# Patient Record
Sex: Female | Born: 1935 | Race: White | Hispanic: No | Smoking: Former smoker
Health system: Southern US, Community
[De-identification: ages and names within clinical notes are randomized; demographics above are authoritative.]

## PROBLEM LIST (undated history)

## (undated) DIAGNOSIS — R1310 Dysphagia, unspecified: Secondary | ICD-10-CM

## (undated) DIAGNOSIS — D649 Anemia, unspecified: Secondary | ICD-10-CM

## (undated) DIAGNOSIS — J449 Chronic obstructive pulmonary disease, unspecified: Secondary | ICD-10-CM

## (undated) DIAGNOSIS — F259 Schizoaffective disorder, unspecified: Secondary | ICD-10-CM

---

## 2004-04-16 ENCOUNTER — Other Ambulatory Visit: Payer: Self-pay

## 2005-01-27 ENCOUNTER — Ambulatory Visit: Payer: Self-pay

## 2005-10-21 ENCOUNTER — Ambulatory Visit: Payer: Self-pay | Admitting: Internal Medicine

## 2006-03-04 ENCOUNTER — Ambulatory Visit: Payer: Self-pay | Admitting: Family Medicine

## 2006-05-27 ENCOUNTER — Other Ambulatory Visit: Payer: Self-pay

## 2006-05-27 ENCOUNTER — Emergency Department: Payer: Self-pay | Admitting: Emergency Medicine

## 2006-07-08 ENCOUNTER — Emergency Department: Payer: Self-pay | Admitting: Emergency Medicine

## 2006-08-05 ENCOUNTER — Ambulatory Visit: Payer: Self-pay | Admitting: Gastroenterology

## 2006-12-11 ENCOUNTER — Ambulatory Visit: Payer: Self-pay

## 2006-12-17 ENCOUNTER — Inpatient Hospital Stay: Payer: Self-pay | Admitting: *Deleted

## 2006-12-17 ENCOUNTER — Other Ambulatory Visit: Payer: Self-pay

## 2007-01-19 ENCOUNTER — Ambulatory Visit: Payer: Self-pay

## 2007-01-22 ENCOUNTER — Emergency Department: Payer: Self-pay | Admitting: Internal Medicine

## 2007-01-22 ENCOUNTER — Other Ambulatory Visit: Payer: Self-pay

## 2007-03-03 ENCOUNTER — Ambulatory Visit: Payer: Self-pay

## 2007-03-16 ENCOUNTER — Ambulatory Visit: Payer: Self-pay

## 2007-11-08 ENCOUNTER — Ambulatory Visit: Payer: Self-pay

## 2007-12-09 ENCOUNTER — Ambulatory Visit: Payer: Self-pay | Admitting: Unknown Physician Specialty

## 2008-10-01 ENCOUNTER — Emergency Department: Payer: Self-pay | Admitting: Emergency Medicine

## 2010-04-17 ENCOUNTER — Ambulatory Visit: Payer: Self-pay | Admitting: Family Medicine

## 2013-09-20 ENCOUNTER — Inpatient Hospital Stay: Payer: Self-pay | Admitting: Specialist

## 2013-09-20 LAB — COMPREHENSIVE METABOLIC PANEL
BUN: 20 mg/dL — ABNORMAL HIGH (ref 7–18)
Bilirubin,Total: 0.3 mg/dL (ref 0.2–1.0)
Calcium, Total: 8.6 mg/dL (ref 8.5–10.1)
Co2: 29 mmol/L (ref 21–32)
EGFR (African American): >60
EGFR (Non-African Amer.): >60
Glucose: 79 mg/dL (ref 65–99)
Osmolality: 281 (ref 275–301)
SGPT (ALT): 8 U/L — ABNORMAL LOW (ref 12–78)
Sodium: 140 mmol/L (ref 136–145)
Total Protein: 5.5 g/dL — ABNORMAL LOW (ref 6.4–8.2)

## 2013-09-20 LAB — URINALYSIS, COMPLETE
Leukocyte Esterase: NEGATIVE
Nitrite: NEGATIVE
Ph: 7 (ref 4.5–8.0)
Protein: 30
Specific Gravity: 1.015 (ref 1.003–1.030)
Squamous Epithelial: 2
WBC UR: 1 /HPF (ref 0–5)

## 2013-09-20 LAB — CBC
HCT: 36 % (ref 35.0–47.0)
MCH: 29.3 pg (ref 26.0–34.0)
MCV: 89 fL (ref 80–100)
Platelet: 115 10*3/uL — ABNORMAL LOW (ref 150–440)
RBC: 4.02 10*6/uL (ref 3.80–5.20)

## 2013-09-20 LAB — PROTIME-INR
INR: 1.1
Prothrombin Time: 14.2 secs (ref 11.5–14.7)

## 2013-09-20 LAB — TROPONIN I: Troponin-I: <0.02 ng/mL

## 2013-09-21 ENCOUNTER — Ambulatory Visit: Payer: Self-pay | Admitting: Internal Medicine

## 2013-09-21 LAB — BASIC METABOLIC PANEL
Co2: 28 mmol/L (ref 21–32)
Creatinine: 0.63 mg/dL (ref 0.60–1.30)
Potassium: 3.8 mmol/L (ref 3.5–5.1)
Sodium: 138 mmol/L (ref 136–145)

## 2013-09-21 LAB — CBC WITH DIFFERENTIAL/PLATELET
Basophil %: 0.6 %
Eosinophil %: 1.4 %
HCT: 29.4 % — ABNORMAL LOW (ref 35.0–47.0)
HGB: 9.5 g/dL — ABNORMAL LOW (ref 12.0–16.0)
Lymphocyte #: 1.7 10*3/uL (ref 1.0–3.6)
MCH: 28.8 pg (ref 26.0–34.0)
MCHC: 32.3 g/dL (ref 32.0–36.0)
MCV: 89 fL (ref 80–100)
Monocyte #: 0.4 x10 3/mm (ref 0.2–0.9)
Monocyte %: 11.1 %
Neutrophil #: 1.6 10*3/uL (ref 1.4–6.5)
Platelet: 76 10*3/uL — ABNORMAL LOW (ref 150–440)
RBC: 3.29 10*6/uL — ABNORMAL LOW (ref 3.80–5.20)
RDW: 14.8 % — ABNORMAL HIGH (ref 11.5–14.5)
WBC: 3.8 10*3/uL (ref 3.6–11.0)

## 2013-09-22 LAB — CBC WITH DIFFERENTIAL/PLATELET
Basophil #: 0 10*3/uL (ref 0.0–0.1)
Eosinophil #: 0.1 10*3/uL (ref 0.0–0.7)
HGB: 11 g/dL — ABNORMAL LOW (ref 12.0–16.0)
MCH: 29.2 pg (ref 26.0–34.0)
MCV: 89 fL (ref 80–100)
Monocyte #: 0.6 x10 3/mm (ref 0.2–0.9)
Monocyte %: 11.2 %
Neutrophil %: 53.1 %
Platelet: 83 10*3/uL — ABNORMAL LOW (ref 150–440)
RBC: 3.77 10*6/uL — ABNORMAL LOW (ref 3.80–5.20)
WBC: 5.4 10*3/uL (ref 3.6–11.0)

## 2013-09-23 LAB — CBC WITH DIFFERENTIAL/PLATELET
Basophil #: 0.1 10*3/uL (ref 0.0–0.1)
Basophil %: 0.9 %
Eosinophil %: 3 %
HCT: 33 % — ABNORMAL LOW (ref 35.0–47.0)
HGB: 10.8 g/dL — ABNORMAL LOW (ref 12.0–16.0)
Lymphocyte #: 1.5 10*3/uL (ref 1.0–3.6)
Lymphocyte %: 25.1 %
MCV: 89 fL (ref 80–100)
Neutrophil %: 61.2 %
Platelet: 76 10*3/uL — ABNORMAL LOW (ref 150–440)
RBC: 3.7 10*6/uL — ABNORMAL LOW (ref 3.80–5.20)
RDW: 15.1 % — ABNORMAL HIGH (ref 11.5–14.5)
WBC: 6.1 10*3/uL (ref 3.6–11.0)

## 2013-09-25 LAB — CULTURE, BLOOD (SINGLE)

## 2013-10-13 ENCOUNTER — Ambulatory Visit: Payer: Self-pay | Admitting: Internal Medicine

## 2014-01-09 ENCOUNTER — Ambulatory Visit: Payer: Self-pay

## 2014-08-10 ENCOUNTER — Ambulatory Visit: Payer: Self-pay | Admitting: Family

## 2015-02-02 NOTE — H&P (Signed)
PATIENT NAME:  Emily HammingIKE, [name] DATE OF BIRTH:  01/20/1932  DATE OF ADMISSION:  09/20/2013  REFERRING PHYSICIAN: Dr. Lucrezia EuropeAllison Bowman.  PRIMARY CARE PHYSICIAN: Dr. Jamesetta SoWhite Bowman  CHIEF COMPLAINT: Altered mental status, weakness.  HISTORY OF PRESENT ILLNESS: This is a 81 year old female who lives at home by herself, with the help of her daughter. Daughter reports she takes care of her mother, reports she usually lives by herself, ambulates by herself, even though she has a walker and cane at home, but she refuses to use them. She has a history of baseline dementia. Daughter reports her mother has been feeling different for the last few days. Reports she has been more confused than her baseline, feeling more weak, requiring more assistance with her daily chores. As well, she reports she has had decreased appetite, decreased p.o. intake and did not ambulate over the last three days due to her weakness. Reports she has been staying with her over the last week or so for help. She denies any fever. She denies her mother having any fever or chills or cough. She thought she had a urinary tract infection, as she had similar presentation last month, where she went to PCP for treatment of urinary tract infection. Her urinalysis came back negative, but the chest x-ray did show evidence of opacity. Hospitalist service was requested to admit the patient for treatment of pneumonia.   PAST MEDICAL HISTORY: 1.  Depression.  2.  Gastroesophageal reflux disease. 3.  Hyperlipidemia.   4.  Chronic obstructive pulmonary disease.  PAST SURGICAL HISTORY: Hysterectomy.   ALLERGIES: ASPIRIN CAUSES GASTROINTESTINAL UPSET.   SOCIAL HISTORY: The patient used to smoke 2 packs of cigarettes on a daily basis. She quit six years ago. No history of alcohol or illicit drug use. Lives at home by herself, with her daughter visiting her on a daily basis, living with her currently for the last week.   FAMILY HISTORY:  Significant for diabetes mellitus and coronary artery disease.   REVIEW OF SYSTEMS: Tried to obtain from the patient. The patient is a very poor historian due to her dementia as well as to altered mental status. Was unable to obtain reliable review of systems.   HOME MEDICATIONS: 1.  Depakote extended release 100 mg oral daily.  2.  Imipramine 50 mg oral at bedtime.  4.  Simvastatin 20 mg oral daily.  5.  Oxybutynin 5 mg 2 times a day as needed.  6.  Famotidine 20 mg oral 2 times a day.  7.  Patient is supposed to be taking Advair 250/50, 1 puff b.i.d. and DuoNebs as needed, but she  is not taking them due to noncompliance, as per her daughter.  PHYSICAL EXAMINATION: VITAL SIGNS: Temperature 98, pulse 82, respiratory rate 20, blood pressure 120/77, saturating 97% on room air.  GENERAL: Elderly female who is comfortable and in no apparent distress.  HEENT: Head atraumatic, normocephalic. Pupils are equal, reactive to light. Pale conjunctivae. Anicteric sclerae. Dry oral mucosa.  NECK: Supple. No thyromegaly. No JVD.  CHEST: Good air entry bilaterally. No wheezing, but had mild rales on the left side with good air entry, no use of accessory muscles. CARDIOVASCULAR: S1, S2 heard. No rubs, murmurs, or gallops.  ABDOMEN: Soft, nontender, nondistended. Bowel sounds present.  EXTREMITIES: No edema. No clubbing. No cyanosis. Pedal pulses felt bilaterally.  PSYCHIATRIC: The patient is sleeping comfortably. Does wake up and respond to verbal commands. Alert x2, but confused.  NEUROLOGICAL: The patient is  noncompliant with the physical exam, but was able to move all her extremities, grossly, without any focal deficits being noticed. Cranial nerves appear to be grossly intact.  LYMPHATIC:  No cervical lymphadenopathy could be appreciated.   PERTINENT LABORATORY DATA: Glucose 79, BUN 20, creatinine 0.71, sodium 140, potassium 3.9, chloride 104, CO2 29, ALT 8, AST less than 3, alkaline phosphatase 69,  albumin 2.8, total protein 5.5. Troponin less than 0.02. White blood cells 7.4, hemoglobin 11.8, hematocrit 36, platelets 115. INR 1.1. Urine negative for leukocyte esterase and nitrite. IMAGING: Chest x-ray, PA and lateral, showing mild retrocardiac air space opacity; may reflect mild pneumonia. CT head without contrast showing atrophy and small vessel disease. No evidence of acute intracranial abnormality.   ASSESSMENT AND PLAN: 1.  Altered mental status, weakness. This is most likely related metabolic encephalopathy from her pneumonia, although the patient has baseline dementia. We will consult physical therapy.  2.  Pneumonia. The patient will be treated for community-acquired pneumonia. She will be started on Rocephin and Zithromax.  3.  History of chronic obstructive pulmonary disease, appears to be not in chronic obstructive pulmonary disease exacerbation. We will continue her on Advair and DuoNebs.  4.  Hyperlipidemia. Continue with statin.  5.  History of depression and psychiatric history. The patient will be continued on her home medications, including venlafaxine and Depakote.  6.  Gastroesophageal reflux disease. Continue with famotidine.  7.  Deep vein thrombosis prophylaxis. Subcutaneous heparin.   CODE STATUS: Discussed with daughter, and son-in-law. Reports that her mother is full code, and they wish for her to go back home once she is finished treatment.   TIME SPENT ON ADMISSION AND PATIENT CARE: 50 minutes.     ____________________________ Emily Arms, MD dse:cg D: 09/20/2013 03:14:00 ET T: 09/20/2013 04:05:09 ET JOB#: [id number]  cc: Emily Arms, MD, <Dictator> Emily Tschantz Teena Irani MD ELECTRONICALLY SIGNED 09/21/2013 8:04

## 2015-02-02 NOTE — Consult Note (Signed)
   Comments   I met with pt's daughter and her significant. The 2 of them live with pt and are her primary caregivers. Daughter is pt's only child and, since husband is deceased, is pt's HCPOA. Family plan for pt to return home at discharge. I spoke with them about PT recommendation for STR and they are in agreement with this. Spoke with CSW who will speak with family. spoke with family about pt's code status. Daughter says that pt's father was on a vent prior to his death and pt always said she did not want to be on "life support". Daughter feels that pt should be a DNR. Order entered. Out-of-facility DNR completed and placed in chart.   Electronic Signatures: Vincentina Sollers, Izora Gala (MD)  (Signed 11-Dec-14 15:07)  Authored: Palliative Care   Last Updated: 11-Dec-14 15:07 by Peace Noyes, Izora Gala (MD)

## 2015-02-02 NOTE — Discharge Summary (Signed)
PATIENT NAME:  [name], Shanaia O MR#:  [id number] DATE OF BIRTH:  1932/06/17  DATE OF ADMISSION:  09/20/2013 DATE OF DISCHARGE:  09/23/2013  For a detailed note, please take a look at the history and physical done on admission by Dr. Randol KernElgergawy.  DISCHARGE DIAGNOSES: 1.  Altered mental status secondary to metabolic encephalopathy from pneumonia.  2.  Pneumonia.  3.  History of dementia and schizoaffective disorder. 4.  Hyperlipidemia. 5.  Urinary incontinence. 6.  Thrombocytopenia.   DISCHARGE DIET: The patient is being discharged on pureed diet with thin liquids with strict aspiration precautions. Meds in puree.  DISCHARGE ACTIVITY: As tolerated.   DISCHARGE FOLLOWUP: With primary care physician at the skilled nursing facility. The patient is being discharged to [facility].   DISCHARGE MEDICATIONS:  1.  Imipramine 50 mg at bedtime. 2.  Simvastatin 20 mg daily. 3.  Depakote 500 mg daily. 4.  Oxybutynin 5 mg b.i.d. 5.  Effexor 150 mg daily. 6.  Famotidine 20 mg b.i.d. 7.  Augmentin 500/125 1 tab b.i.d. x7 days.   CONSULTANTS DURING HOSPITAL COURSE: Dr. Harriett SineNancy Phifer from palliative care.   PERTINENT STUDIES DONE DURING THE HOSPITAL COURSE: A CT scan of the head done on admission without contrast showing atrophy with small vessel ischemic disease. No evidence of acute intracranial process. A chest x-ray done on December 9th showing mild retrocardiac air space opacity, may reflect mild pneumonia. A repeat chest x-ray done on December 11th showing right basilar air space disease, could be due to atelectasis, aspiration, or pneumonia.   HOSPITAL COURSE: This is a 81 year old female who presented to the hospital from her home due to altered mental status.  1.  Altered mental status. The most likely cause of this was metabolic encephalopathy due to underlying pneumonia. The patient was noted to have a pneumonia on chest x-ray on admission, empirically started on IV ceftriaxone and Zithromax.  She underwent a CT head which showed no evidence of any acute intracranial abnormality. She had no other focal metabolic or infectious etiology to explain her mental status change. After getting IV antibiotics for a few days her mental status has significantly improved and now back to baseline. Therefore, she is being discharged.  2.  Pneumonia. Initially, the patient was treated for community-acquired pneumonia with ceftriaxone and Zithromax and then switched over to Zosyn for a day as she possibly may have had an aspiration event, although the day after her possible aspiration event she has clinically significantly improved. Her O2 sats are in the 90s on room air. She is tolerating a pureed diet significantly well. At this point, I will be discharging her on oral Augmentin for the next 7 days.  3.  Hyperlipidemia. The patient was maintained on simvastatin. She will resume that. 4.  History of dementia with schizoaffective disorder. She will continue her Effexor and Depakote. 5.  Neurogenic bladder/incontinence. Continue the oxybutynin and imipramine.  6.  Thrombocytopenia. The patient's platelet count was as low as 80,000. She has had no evidence of acute bleeding. She was started on heparin subcu for DVT prophylaxis, which was discontinued. Her thrombocytopenia could be related to her Depakote that she is taking, but she has had no acute bleeding. This further needs to be followed as an outpatient with serial CBCs.  7.  Failure to thrive. This was likely secondary to advanced dementia. A palliative care consult was obtained to discuss goals of care with the family and the patient was a full code prior to coming  in, although now is a DNI/DNR. The patient was seen by physical therapy and thought she would benefit from short-term rehab as she came from home. The family is in agreement with this plan.   TIME SPENT WITH DISCHARGE: 40 minutes.  ____________________________ Rolly Pancake. Cherlynn Kaiser,  MD vjs:sb D: 09/23/2013 14:13:22 ET T: 09/23/2013 14:44:18 ET JOB#: [id number]  cc: Rolly Pancake. Cherlynn Kaiser, MD, <Dictator> Houston Siren MD ELECTRONICALLY SIGNED 10/06/2013 13:40

## 2015-05-26 ENCOUNTER — Emergency Department: Payer: Medicare (Managed Care)

## 2015-05-26 ENCOUNTER — Encounter: Payer: Self-pay | Admitting: *Deleted

## 2015-05-26 ENCOUNTER — Inpatient Hospital Stay
Admission: EM | Admit: 2015-05-26 | Discharge: 2015-06-14 | DRG: 391 | Disposition: E | Payer: Medicare (Managed Care) | Attending: Internal Medicine | Admitting: Internal Medicine

## 2015-05-26 DIAGNOSIS — F0390 Unspecified dementia without behavioral disturbance: Secondary | ICD-10-CM | POA: Diagnosis present

## 2015-05-26 DIAGNOSIS — F259 Schizoaffective disorder, unspecified: Secondary | ICD-10-CM | POA: Diagnosis present

## 2015-05-26 DIAGNOSIS — Z66 Do not resuscitate: Secondary | ICD-10-CM | POA: Diagnosis present

## 2015-05-26 DIAGNOSIS — R1310 Dysphagia, unspecified: Secondary | ICD-10-CM | POA: Diagnosis present

## 2015-05-26 DIAGNOSIS — J9690 Respiratory failure, unspecified, unspecified whether with hypoxia or hypercapnia: Secondary | ICD-10-CM

## 2015-05-26 DIAGNOSIS — N390 Urinary tract infection, site not specified: Secondary | ICD-10-CM | POA: Diagnosis present

## 2015-05-26 DIAGNOSIS — R1030 Lower abdominal pain, unspecified: Secondary | ICD-10-CM | POA: Diagnosis not present

## 2015-05-26 DIAGNOSIS — Z888 Allergy status to other drugs, medicaments and biological substances status: Secondary | ICD-10-CM | POA: Diagnosis not present

## 2015-05-26 DIAGNOSIS — Z79899 Other long term (current) drug therapy: Secondary | ICD-10-CM | POA: Diagnosis not present

## 2015-05-26 DIAGNOSIS — D649 Anemia, unspecified: Secondary | ICD-10-CM | POA: Diagnosis present

## 2015-05-26 DIAGNOSIS — J9600 Acute respiratory failure, unspecified whether with hypoxia or hypercapnia: Secondary | ICD-10-CM | POA: Diagnosis present

## 2015-05-26 DIAGNOSIS — J449 Chronic obstructive pulmonary disease, unspecified: Secondary | ICD-10-CM | POA: Diagnosis present

## 2015-05-26 DIAGNOSIS — K3189 Other diseases of stomach and duodenum: Principal | ICD-10-CM | POA: Diagnosis present

## 2015-05-26 DIAGNOSIS — I509 Heart failure, unspecified: Secondary | ICD-10-CM | POA: Diagnosis present

## 2015-05-26 DIAGNOSIS — K449 Diaphragmatic hernia without obstruction or gangrene: Secondary | ICD-10-CM | POA: Diagnosis not present

## 2015-05-26 DIAGNOSIS — Z87891 Personal history of nicotine dependence: Secondary | ICD-10-CM

## 2015-05-26 DIAGNOSIS — J690 Pneumonitis due to inhalation of food and vomit: Secondary | ICD-10-CM | POA: Diagnosis present

## 2015-05-26 HISTORY — DX: Anemia, unspecified: D64.9

## 2015-05-26 HISTORY — DX: Chronic obstructive pulmonary disease, unspecified: J44.9

## 2015-05-26 HISTORY — DX: Dysphagia, unspecified: R13.10

## 2015-05-26 HISTORY — DX: Schizoaffective disorder, unspecified: F25.9

## 2015-05-26 LAB — COMPREHENSIVE METABOLIC PANEL
ALBUMIN: 3.7 g/dL (ref 3.5–5.0)
ALK PHOS: 92 U/L (ref 38–126)
ALT: 15 U/L (ref 14–54)
AST: 24 U/L (ref 15–41)
Anion gap: 10 (ref 5–15)
BILIRUBIN TOTAL: 0.4 mg/dL (ref 0.3–1.2)
BUN: 24 mg/dL — AB (ref 6–20)
CALCIUM: 9.6 mg/dL (ref 8.9–10.3)
CHLORIDE: 99 mmol/L — AB (ref 101–111)
CO2: 31 mmol/L (ref 22–32)
Creatinine, Ser: 0.9 mg/dL (ref 0.44–1.00)
GFR, EST NON AFRICAN AMERICAN: 58 mL/min — AB (ref >60)
GLUCOSE: 157 mg/dL — AB (ref 65–99)
Potassium: 4.4 mmol/L (ref 3.5–5.1)
Sodium: 140 mmol/L (ref 135–145)
Total Protein: 6.9 g/dL (ref 6.5–8.1)

## 2015-05-26 LAB — TSH: TSH: 4.079 u[IU]/mL (ref 0.350–4.500)

## 2015-05-26 LAB — LIPASE, BLOOD: LIPASE: 16 U/L — AB (ref 22–51)

## 2015-05-26 LAB — URINALYSIS COMPLETE WITH MICROSCOPIC (ARMC ONLY)
BILIRUBIN URINE: NEGATIVE
GLUCOSE, UA: NEGATIVE mg/dL
HGB URINE DIPSTICK: NEGATIVE
LEUKOCYTES UA: NEGATIVE
NITRITE: POSITIVE — AB
PH: 5 (ref 5.0–8.0)
PROTEIN: 30 mg/dL — AB
RBC / HPF: NONE SEEN RBC/hpf (ref 0–5)
Specific Gravity, Urine: 1.023 (ref 1.005–1.030)

## 2015-05-26 LAB — HEMOGLOBIN A1C: Hgb A1c MFr Bld: 6.2 % — ABNORMAL HIGH (ref 4.0–6.0)

## 2015-05-26 LAB — CBC
HEMATOCRIT: 42.1 % (ref 35.0–47.0)
HEMOGLOBIN: 13.4 g/dL (ref 12.0–16.0)
MCH: 30.8 pg (ref 26.0–34.0)
MCHC: 31.8 g/dL — ABNORMAL LOW (ref 32.0–36.0)
MCV: 96.8 fL (ref 80.0–100.0)
PLATELETS: 231 10*3/uL (ref 150–440)
RBC: 4.34 MIL/uL (ref 3.80–5.20)
RDW: 14 % (ref 11.5–14.5)
WBC: 18.4 10*3/uL — ABNORMAL HIGH (ref 3.6–11.0)

## 2015-05-26 LAB — GLUCOSE, CAPILLARY: Glucose-Capillary: 142 mg/dL — ABNORMAL HIGH (ref 65–99)

## 2015-05-26 MED ORDER — ENSURE PLUS PO LIQD: 237.0000 mL | Freq: Every day | ORAL | Status: DC

## 2015-05-26 MED ORDER — IOHEXOL 240 MG/ML SOLN
25.0000 mL | Freq: Once | INTRAMUSCULAR | Status: AC | PRN
  Administered 2015-05-26: 25 mL via ORAL

## 2015-05-26 MED ORDER — DOCUSATE SODIUM 100 MG PO CAPS
100.0000 mg | ORAL_CAPSULE | Freq: Every day | ORAL | Status: DC | PRN
  Administered 2015-05-26: 100 mg via ORAL
  Filled 2015-05-26: qty 1

## 2015-05-26 MED ORDER — VITAMIN D (ERGOCALCIFEROL) 1.25 MG (50000 UNIT) PO CAPS
50000.0000 [IU] | ORAL_CAPSULE | ORAL | Status: DC
  Administered 2015-05-26: 50000 [IU] via ORAL
  Filled 2015-05-26: qty 1

## 2015-05-26 MED ORDER — ERGOCALCIFEROL 1.25 MG (50000 UT) PO CAPS: 50000.0000 [IU] | ORAL_CAPSULE | ORAL | Status: DC

## 2015-05-26 MED ORDER — PANTOPRAZOLE SODIUM 40 MG IV SOLR
40.0000 mg | Freq: Two times a day (BID) | INTRAVENOUS | Status: DC
  Administered 2015-05-26 – 2015-05-28 (×4): 40 mg via INTRAVENOUS
  Filled 2015-05-26 (×4): qty 40

## 2015-05-26 MED ORDER — MORPHINE SULFATE 2 MG/ML IJ SOLN
2.0000 mg | Freq: Once | INTRAMUSCULAR | Status: AC
  Administered 2015-05-26: 2 mg via INTRAVENOUS
  Filled 2015-05-26: qty 1

## 2015-05-26 MED ORDER — DEXTROSE 5 % IV SOLN
1.0000 g | INTRAVENOUS | Status: DC
  Filled 2015-05-26 (×2): qty 10

## 2015-05-26 MED ORDER — VITAMIN B-12 1000 MCG PO TABS
1000.0000 ug | ORAL_TABLET | ORAL | Status: DC
  Administered 2015-05-26: 1000 ug via ORAL
  Filled 2015-05-26: qty 1

## 2015-05-26 MED ORDER — ENSURE ENLIVE PO LIQD
237.0000 mL | Freq: Every day | ORAL | Status: DC
  Administered 2015-05-26: 237 mL via ORAL

## 2015-05-26 MED ORDER — FERROUS SULFATE 325 (65 FE) MG PO TABS
325.0000 mg | ORAL_TABLET | Freq: Two times a day (BID) | ORAL | Status: DC
  Administered 2015-05-26: 325 mg via ORAL
  Filled 2015-05-26 (×2): qty 1

## 2015-05-26 MED ORDER — HEPARIN SODIUM (PORCINE) 5000 UNIT/ML IJ SOLN
5000.0000 [IU] | Freq: Three times a day (TID) | INTRAMUSCULAR | Status: DC
  Administered 2015-05-26 – 2015-05-28 (×7): 5000 [IU] via SUBCUTANEOUS
  Filled 2015-05-26 (×7): qty 1

## 2015-05-26 MED ORDER — IMIPRAMINE HCL 25 MG PO TABS
50.0000 mg | ORAL_TABLET | Freq: Every day | ORAL | Status: DC
  Filled 2015-05-26: qty 2

## 2015-05-26 MED ORDER — DEXTROSE 5 % IV SOLN
1.0000 g | Freq: Once | INTRAVENOUS | Status: AC
  Administered 2015-05-26: 1 g via INTRAVENOUS
  Filled 2015-05-26: qty 10

## 2015-05-26 MED ORDER — SODIUM CHLORIDE 0.9 % IV SOLN
INTRAVENOUS | Status: DC
  Administered 2015-05-26 – 2015-05-27 (×3): via INTRAVENOUS

## 2015-05-26 MED ORDER — MORPHINE SULFATE 2 MG/ML IJ SOLN
1.0000 mg | INTRAMUSCULAR | Status: DC | PRN
  Administered 2015-05-26 – 2015-05-28 (×4): 1 mg via INTRAVENOUS
  Filled 2015-05-26 (×4): qty 1

## 2015-05-26 MED ORDER — ACETAMINOPHEN 650 MG RE SUPP: 650.0000 mg | Freq: Four times a day (QID) | RECTAL | Status: DC | PRN

## 2015-05-26 MED ORDER — ONDANSETRON HCL 4 MG/2ML IJ SOLN
4.0000 mg | Freq: Once | INTRAMUSCULAR | Status: AC
  Administered 2015-05-26: 4 mg via INTRAVENOUS
  Filled 2015-05-26: qty 2

## 2015-05-26 MED ORDER — POLYETHYLENE GLYCOL 3350 17 G PO PACK
17.0000 g | PACK | Freq: Every day | ORAL | Status: DC | PRN
  Administered 2015-05-26: 17 g via ORAL
  Filled 2015-05-26: qty 1

## 2015-05-26 MED ORDER — IOHEXOL 300 MG/ML  SOLN
80.0000 mL | Freq: Once | INTRAMUSCULAR | Status: AC | PRN
  Administered 2015-05-26: 80 mL via INTRAVENOUS

## 2015-05-26 MED ORDER — DOCUSATE SODIUM 100 MG PO CAPS
100.0000 mg | ORAL_CAPSULE | Freq: Two times a day (BID) | ORAL | Status: DC
Start: 2015-05-26 — End: 2015-05-27
  Administered 2015-05-26: 100 mg via ORAL
  Filled 2015-05-26 (×2): qty 1

## 2015-05-26 MED ORDER — CARBOXYMETHYLCELLUL-GLYCERIN 0.5-0.9 % OP SOLN: 2.0000 [drp] | Freq: Every day | OPHTHALMIC | Status: DC

## 2015-05-26 MED ORDER — DIVALPROEX SODIUM ER 500 MG PO TB24
500.0000 mg | ORAL_TABLET | Freq: Every day | ORAL | Status: DC
  Administered 2015-05-26: 500 mg via ORAL
  Filled 2015-05-26: qty 1

## 2015-05-26 MED ORDER — HYDROMORPHONE HCL 1 MG/ML IJ SOLN: 0.5000 mg | INTRAMUSCULAR | Status: DC | PRN

## 2015-05-26 MED ORDER — VENLAFAXINE HCL ER 75 MG PO CP24
150.0000 mg | ORAL_CAPSULE | ORAL | Status: DC
  Administered 2015-05-26: 150 mg via ORAL
  Filled 2015-05-26: qty 2

## 2015-05-26 MED ORDER — ONDANSETRON 4 MG PO TBDP
4.0000 mg | ORAL_TABLET | Freq: Three times a day (TID) | ORAL | Status: DC | PRN
  Administered 2015-05-27: 4 mg via ORAL
  Filled 2015-05-26 (×2): qty 1

## 2015-05-26 MED ORDER — ACETAMINOPHEN 325 MG PO TABS: 650.0000 mg | ORAL_TABLET | Freq: Four times a day (QID) | ORAL | Status: DC | PRN

## 2015-05-26 MED ORDER — PANTOPRAZOLE SODIUM 40 MG PO TBEC
40.0000 mg | DELAYED_RELEASE_TABLET | Freq: Every day | ORAL | Status: DC
  Administered 2015-05-26: 40 mg via ORAL
  Filled 2015-05-26: qty 1

## 2015-05-26 MED ORDER — SODIUM CHLORIDE 0.9 % IV BOLUS (SEPSIS)
500.0000 mL | Freq: Once | INTRAVENOUS | Status: AC
  Administered 2015-05-26: 500 mL via INTRAVENOUS

## 2015-05-26 MED ORDER — SUCRALFATE 1 G PO TABS
1.0000 g | ORAL_TABLET | Freq: Three times a day (TID) | ORAL | Status: DC
  Administered 2015-05-26 (×3): 1 g via ORAL
  Filled 2015-05-26 (×3): qty 1

## 2015-05-26 MED ORDER — OXYBUTYNIN CHLORIDE ER 5 MG PO TB24
5.0000 mg | ORAL_TABLET | Freq: Every day | ORAL | Status: DC
  Filled 2015-05-26: qty 1

## 2015-05-26 MED ORDER — POLYVINYL ALCOHOL 1.4 % OP SOLN
2.0000 [drp] | Freq: Every day | OPHTHALMIC | Status: DC
  Administered 2015-05-26 – 2015-05-28 (×5): 2 [drp] via OPHTHALMIC
  Filled 2015-05-26 (×3): qty 15

## 2015-05-26 MED ORDER — SIMVASTATIN 20 MG PO TABS
20.0000 mg | ORAL_TABLET | Freq: Every evening | ORAL | Status: DC
  Administered 2015-05-26: 20 mg via ORAL
  Filled 2015-05-26: qty 1

## 2015-05-26 NOTE — Consult Note (Signed)
GI Inpatient Consult Note  Reason for Consult: Abdominal pain and abnormal x-ray showing high grade gastric volvulus.   Attending Requesting Consult:  History of Present Illness: [name] is a 81 y.o. female Who lives in a rest home and presented to the ER with abdominal pain and vomiting.  Her CT showed a large paraesophageal involving most of the stomach.  She is demented but can answer questions that are simple and perform simple commands.  Past Medical History:  Past Medical History  Diagnosis Date  . Schizoaffective disorder   . Anemia   . COPD (chronic obstructive pulmonary disease)   . Dysphagia     Problem List: Patient Active Problem List   Diagnosis Date Noted  . UTI (lower urinary tract infection) 06/25/2015    Past Surgical History: History reviewed. No pertinent past surgical history.  Allergies: Allergies  Allergen Reactions  . Aspirin     Home Medications: Prescriptions prior to admission  Medication Sig Dispense Refill Last Dose  . Carboxymethylcellul-Glycerin (OPTIVE) 0.5-0.9 % SOLN Place 2 drops into the left eye 6 (six) times daily.   05/25/2015 at Unknown time  . divalproex (DEPAKOTE ER) 500 MG 24 hr tablet Take 500 mg by mouth daily.   05/25/2015 at Unknown time  . docusate sodium (COLACE) 100 MG capsule Take 100 mg by mouth daily as needed for mild constipation (at bedtime).   unknown at unknown  . ENSURE PLUS (ENSURE PLUS) LIQD Take 237 mLs by mouth daily.   05/25/2015 at Unknown time  . ergocalciferol (VITAMIN D2) 50000 UNITS capsule Take 50,000 Units by mouth every 30 (thirty) days.   unknown at unknown  . ferrous sulfate (FEOSOL) 325 (65 FE) MG tablet Take 325 mg by mouth 2 (two) times daily.   05/25/2015 at Unknown time  . imipramine (TOFRANIL) 50 MG tablet Take 50 mg by mouth at bedtime.   05/25/2015 at Unknown time  . ondansetron (ZOFRAN-ODT) 4 MG disintegrating tablet Take 4 mg by mouth every 8 (eight) hours as needed for nausea or vomiting.    05/25/2015 at Unknown time  . oxybutynin (DITROPAN-XL) 5 MG 24 hr tablet Take 5 mg by mouth at bedtime.   05/25/2015 at Unknown time  . pantoprazole (PROTONIX) 40 MG tablet Take 40 mg by mouth daily.   05/25/2015 at Unknown time  . polyethylene glycol (MIRALAX / GLYCOLAX) packet Take 17 g by mouth daily as needed for mild constipation.   unknown at unknown  . simvastatin (ZOCOR) 20 MG tablet Take 20 mg by mouth every evening.   05/25/2015 at Unknown time  . sucralfate (CARAFATE) 1 G tablet Take 1 g by mouth 3 (three) times daily.   05/25/2015 at Unknown time  . venlafaxine XR (EFFEXOR-XR) 150 MG 24 hr capsule Take 150 mg by mouth every morning.   05/25/2015 at Unknown time  . vitamin B-12 (CYANOCOBALAMIN) 1000 MCG tablet Take 1,000 mcg by mouth every morning.   05/25/2015 at Unknown time   Home medication reconciliation was completed with the patient.   Scheduled Inpatient Medications:   . [START ON 05/27/2015] cefTRIAXone (ROCEPHIN)  IV  1 g Intravenous Q24H  . divalproex  500 mg Oral Daily  . docusate sodium  100 mg Oral BID  . feeding supplement (ENSURE ENLIVE)  237 mL Oral Daily  . ferrous sulfate  325 mg Oral BID  . heparin  5,000 Units Subcutaneous 3 times per day  . imipramine  50 mg Oral QHS  . oxybutynin  5 mg Oral QHS  . pantoprazole (PROTONIX) IV  40 mg Intravenous Q12H  . polyvinyl alcohol  2 drop Left Eye 6 X Daily  . simvastatin  20 mg Oral QPM  . sucralfate  1 g Oral TID  . venlafaxine XR  150 mg Oral BH-q7a  . vitamin B-12  1,000 mcg Oral BH-q7a  . Vitamin D (Ergocalciferol)  50,000 Units Oral Q30 days    Continuous Inpatient Infusions:   . sodium chloride 100 mL/hr at June 22, 2015 0749    PRN Inpatient Medications:  acetaminophen **OR** acetaminophen, docusate sodium, HYDROmorphone (DILAUDID) injection, morphine injection, ondansetron, polyethylene glycol  Family History: family history is not on file.  The patient's family history is negative for inflammatory bowel  disorders, GI malignancy, or solid organ transplantation.  Social History:   reports that she has quit smoking. She does not have any smokeless tobacco history on file. She reports that she does not drink alcohol or use illicit drugs. The patient denies ETOH, tobacco, or drug use.   Review of Systems:Not able to obtain due to dementia.  Ignore the following determinations below this line. Constitutional: Weight is stable.  Eyes: No changes in vision. ENT: No oral lesions, sore throat.  GI: see HPI.  Heme/Lymph: No easy bruising.  CV: No chest pain.  GU: No hematuria.  Integumentary: No rashes.  Neuro: No headaches.  Psych: No depression/anxiety.  Endocrine: No heat/cold intolerance.  Allergic/Immunologic: No urticaria.  Resp: No cough, SOB.  Musculoskeletal: No joint swelling.    Physical Examination: BP 142/55 mmHg  Pulse 73  Temp(Src) 97.4 F (36.3 C) (Oral)  Resp 18  Ht 5' (1.524 m)  Wt 52.572 kg (115 lb 14.4 oz)  BMI 22.64 kg/m2  SpO2 93% Gen: NAD, alert and oriented x 4 HEENT: PEERLA, EOMI, Neck: supple, no JVD or thyromegaly Chest: CTA bilaterally, no wheezes, crackles, or other adventitious sounds CV: RRR, no m/g/c/r Abd: soft, NT, ND, +BS in all four quadrants; no HSM, guarding, ridigity, or rebound tenderness Ext: no edema Skin: no rash or lesions noted Lymph: no LAD  Data: Lab Results  Component Value Date   WBC 18.4* 2015-06-22   HGB 13.4 2015-06-22   HCT 42.1 06-22-2015   MCV 96.8 06-22-2015   PLT 231 2015/06/22    Recent Labs Lab 06-22-15 0043  HGB 13.4   Lab Results  Component Value Date   NA 140 22-Jun-2015   K 4.4 2015-06-22   CL 99* 06-22-15   CO2 31 2015-06-22   BUN 24* 06/22/15   CREATININE 0.90 06/22/2015   Lab Results  Component Value Date   ALT 15 2015-06-22   AST 24 2015/06/22   ALKPHOS 92 June 22, 2015   BILITOT 0.4 June 22, 2015   No results for input(s): APTT, INR, PTT in the last 168 hours. Assessment/Plan: [name] is  a 81 y.o. 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  No further testing recommended at this time in the absence of change in symptoms.  Recommendations:  Resume usual care, call for further  Problems.  Thank you for the consult. Please call with questions or concerns.  Lynnae Prude, MD

## 2015-05-26 NOTE — H&P (Signed)
[name] is an 81 y.o. female.   Chief Complaint: Vomiting HPI: The patient presents emergency department from her nursing home due to report of vomiting. The past medical history is significant for dementia and schizoaffective disorder contribute to the patient's inability to provide a clear history. In the emergency department she was found to have a distended abdomen, leukocytosis as well as urinalysis positive for nitrites which prompted emergency department to call for admission.  Past Medical History  Diagnosis Date  . Schizoaffective disorder   . Anemia   . COPD (chronic obstructive pulmonary disease)   . Dysphagia     History reviewed. No pertinent past surgical history. patient cannot provide history  History reviewed. No pertinent family history. patient cannot provide history Social History:  reports that she has quit smoking. She does not have any smokeless tobacco history on file. She reports that she does not drink alcohol or use illicit drugs.  Allergies:  Allergies  Allergen Reactions  . Aspirin     Prior to Admission medications   Medication Sig Start Date End Date Taking? Authorizing Provider  Carboxymethylcellul-Glycerin (OPTIVE) 0.5-0.9 % SOLN Place 2 drops into the left eye 6 (six) times daily.   Yes Historical Provider, MD  divalproex (DEPAKOTE ER) 500 MG 24 hr tablet Take 500 mg by mouth daily.   Yes Historical Provider, MD  docusate sodium (COLACE) 100 MG capsule Take 100 mg by mouth daily as needed for mild constipation (at bedtime).   Yes Historical Provider, MD  ENSURE PLUS (ENSURE PLUS) LIQD Take 237 mLs by mouth daily.   Yes Historical Provider, MD  ergocalciferol (VITAMIN D2) 50000 UNITS capsule Take 50,000 Units by mouth every 30 (thirty) days.   Yes Historical Provider, MD  ferrous sulfate (FEOSOL) 325 (65 FE) MG tablet Take 325 mg by mouth 2 (two) times daily.   Yes Historical Provider, MD  imipramine (TOFRANIL) 50 MG tablet Take 50 mg by mouth at  bedtime.   Yes Historical Provider, MD  ondansetron (ZOFRAN-ODT) 4 MG disintegrating tablet Take 4 mg by mouth every 8 (eight) hours as needed for nausea or vomiting.   Yes Historical Provider, MD  oxybutynin (DITROPAN-XL) 5 MG 24 hr tablet Take 5 mg by mouth at bedtime.   Yes Historical Provider, MD  pantoprazole (PROTONIX) 40 MG tablet Take 40 mg by mouth daily.   Yes Historical Provider, MD  polyethylene glycol (MIRALAX / GLYCOLAX) packet Take 17 g by mouth daily as needed for mild constipation.   Yes Historical Provider, MD  simvastatin (ZOCOR) 20 MG tablet Take 20 mg by mouth every evening.   Yes Historical Provider, MD  sucralfate (CARAFATE) 1 G tablet Take 1 g by mouth 3 (three) times daily.   Yes Historical Provider, MD  venlafaxine XR (EFFEXOR-XR) 150 MG 24 hr capsule Take 150 mg by mouth every morning.   Yes Historical Provider, MD  vitamin B-12 (CYANOCOBALAMIN) 1000 MCG tablet Take 1,000 mcg by mouth every morning.   Yes Historical Provider, MD     Results for orders placed or performed during the hospital encounter of 06/07/2015 (from the past 48 hour(s))  Lipase, blood     Status: Abnormal   Collection Time: 06/03/2015 12:43 AM  Result Value Ref Range   Lipase 16 (L) 22 - 51 U/L  Comprehensive metabolic panel     Status: Abnormal   Collection Time: 05/22/2015 12:43 AM  Result Value Ref Range   Sodium 140 135 - 145 mmol/L  Potassium 4.4 3.5 - 5.1 mmol/L   Chloride 99 (L) 101 - 111 mmol/L   CO2 31 22 - 32 mmol/L   Glucose, Bld 157 (H) 65 - 99 mg/dL   BUN 24 (H) 6 - 20 mg/dL   Creatinine, Ser 0.90 0.44 - 1.00 mg/dL   Calcium 9.6 8.9 - 10.3 mg/dL   Total Protein 6.9 6.5 - 8.1 g/dL   Albumin 3.7 3.5 - 5.0 g/dL   AST 24 15 - 41 U/L   ALT 15 14 - 54 U/L   Alkaline Phosphatase 92 38 - 126 U/L   Total Bilirubin 0.4 0.3 - 1.2 mg/dL   GFR calc non Af Amer 58 (L) >60 mL/min   GFR calc Af Amer >60 >60 mL/min    Comment: (NOTE) The eGFR has been calculated using the CKD EPI  equation. This calculation has not been validated in all clinical situations. eGFR's persistently <60 mL/min signify possible Chronic Kidney Disease.    Anion gap 10 5 - 15  CBC     Status: Abnormal   Collection Time: 05/25/2015 12:43 AM  Result Value Ref Range   WBC 18.4 (H) 3.6 - 11.0 K/uL   RBC 4.34 3.80 - 5.20 MIL/uL   Hemoglobin 13.4 12.0 - 16.0 g/dL   HCT 42.1 35.0 - 47.0 %   MCV 96.8 80.0 - 100.0 fL   MCH 30.8 26.0 - 34.0 pg   MCHC 31.8 (L) 32.0 - 36.0 g/dL   RDW 14.0 11.5 - 14.5 %   Platelets 231 150 - 440 K/uL  Urinalysis complete, with microscopic ([facility] only)     Status: Abnormal   Collection Time: 05/15/2015 12:43 AM  Result Value Ref Range   Color, Urine AMBER (A) YELLOW   APPearance CLOUDY (A) CLEAR   Glucose, UA NEGATIVE NEGATIVE mg/dL   Bilirubin Urine NEGATIVE NEGATIVE   Ketones, ur TRACE (A) NEGATIVE mg/dL   Specific Gravity, Urine 1.023 1.005 - 1.030   Hgb urine dipstick NEGATIVE NEGATIVE   pH 5.0 5.0 - 8.0   Protein, ur 30 (A) NEGATIVE mg/dL   Nitrite POSITIVE (A) NEGATIVE   Leukocytes, UA NEGATIVE NEGATIVE   RBC / HPF NONE SEEN 0 - 5 RBC/hpf   WBC, UA 0-5 0 - 5 WBC/hpf   Bacteria, UA RARE (A) NONE SEEN   Squamous Epithelial / LPF 0-5 (A) NONE SEEN   Mucous PRESENT    Ct Abdomen Pelvis W Contrast  06/02/2015   CLINICAL DATA:  Acute onset of vomiting and hematemesis. Lower abdominal pain and dysuria. Initial encounter.  EXAM: CT ABDOMEN AND PELVIS WITH CONTRAST  TECHNIQUE: Multidetector CT imaging of the abdomen and pelvis was performed using the standard protocol following bolus administration of intravenous contrast.  CONTRAST:  86mL OMNIPAQUE IOHEXOL 300 MG/ML  SOLN  COMPARISON:  CT of the abdomen and pelvis from 08/10/2014  FINDINGS: Bibasilar atelectasis is noted.  A large paraesophageal hernia is seen, containing the entirety of the stomach. The stomach is largely decompressed. Apparent diffuse gastric wall thickening could reflect gastritis, given the  patient's symptoms. There is no evidence for obstruction.  The liver and spleen are unremarkable in appearance. The gallbladder is difficult to fully assess due to motion artifact, but appears grossly unremarkable. The pancreas and adrenal glands are unremarkable.  Bilateral parapelvic renal cysts are seen, more prominent on the left. There is no evidence of hydronephrosis. No renal or ureteral stones are seen. No perinephric stranding is appreciated.  There is dilatation of  the proximal abdominal aorta to 3.7 cm in AP dimension and 3.5 cm in transverse dimension, at the level of the superior mesenteric artery. Underlying diffuse calcification is noted along the abdominal aorta and its branches. Aneurysmal dilatation resolves just above the level of the renal arteries. Diffuse calcification is noted along the celiac trunk and superior mesenteric artery, and at the origins of the renal arteries. There is ectasia of the infrarenal abdominal aorta, without additional aneurysmal dilatation.  No free fluid is identified. The small bowel is unremarkable in appearance. No acute vascular abnormalities are seen.  The appendix is not well seen. There is no evidence of appendicitis. The colon is grossly unremarkable in appearance, though difficult to fully assess due to motion artifact. Stool is seen partially filling the colon.  The bladder is moderately distended and grossly unremarkable. The patient is status hysterectomy. No suspicious adnexal masses are seen. The ovaries are grossly symmetric. No inguinal lymphadenopathy is seen.  No acute osseous abnormalities are identified. Endplate sclerotic change and vacuum phenomenon are seen at L3-L4, and vacuum phenomenon is noted at L5-S1.  IMPRESSION: 1. Large paraesophageal hernia containing the entirety of the stomach. Apparent diffuse gastric wall thickening could reflect gastritis, given the patient's symptoms. No evidence for obstruction at this time. 2. Dilatation of the  proximal abdominal aorta to 3.7 cm in AP dimension and 3.5 cm in transverse dimension, at the level of the superior mesenteric artery. Aneurysmal dilatation resolves just above the level of the renal arteries. Recommend followup by ultrasound in 2 years. This recommendation follows ACR consensus guidelines: White Paper of the ACR Incidental Findings Committee II on Vascular Findings. J Am Coll Radiol 2013; 10:789-794. Underlying diffuse calcification noted along the abdominal aorta, and along the celiac trunk and superior mesenteric artery. Calcification seen at the origins of the renal arteries. 3. Bibasilar atelectasis noted. 4. Bilateral parapelvic renal cysts noted, more prominent on the left.   Electronically Signed   By: Garald Balding M.D.   On: 05/14/2015 03:51    Review of Systems  Unable to perform ROS: dementia  Respiratory: Negative for shortness of breath.   Cardiovascular: Negative for chest pain.  Gastrointestinal: Negative for abdominal pain.  Neurological: Negative for headaches.    Blood pressure 137/58, pulse 75, temperature 98.3 F (36.8 C), temperature source Oral, resp. rate 18, height 5' (1.524 m), weight 56.7 kg (125 lb), SpO2 97 %. Physical Exam  Vitals reviewed. Constitutional: She is oriented to person, place, and time. She appears well-developed and well-nourished. No distress.  HENT:  Head: Normocephalic and atraumatic.  Mouth/Throat: Oropharynx is clear and moist.  Eyes: Conjunctivae and EOM are normal. Pupils are equal, round, and reactive to light.  Neck: Normal range of motion. Neck supple. No JVD present. No tracheal deviation present. No thyromegaly present.  Cardiovascular: Normal rate, regular rhythm and normal heart sounds.  Exam reveals no gallop and no friction rub.   No murmur heard. Respiratory: Effort normal and breath sounds normal.  GI: Soft. Bowel sounds are normal. She exhibits distension. She exhibits no mass. There is no tenderness. There is no  rebound and no guarding.  Genitourinary:  Deferred  Musculoskeletal: Normal range of motion. She exhibits no edema.  Lymphadenopathy:    She has no cervical adenopathy.  Neurological: She is alert and oriented to person, place, and time. No cranial nerve deficit. She exhibits normal muscle tone.  Skin: Skin is warm and dry. No rash noted. No erythema.  Psychiatric: She has  a normal mood and affect. Her behavior is normal. Judgment and thought content normal.     Assessment/Plan 81 year old Caucasian female admitted for UTI. 1. UTI: Nitrite positive urine. The patient does not meet criteria for sepsis at this time. Rocephin daily. Follow urine culture results and sensitivities. 2. Schizoaffective disorder: Apparently stable but contributes to the patient's inability to contribute to her own history. No indication that her mental status is below baseline 3. COPD: Continue inhalers per home regimen 4. DVT prophylaxis: Heparin 5. GI prophylaxis: None The patient is a DO NOT RESUSCITATE. Time spent on admission orders and patient care approximately 35 minutes  Harrie Foreman 06/04/2015, 5:56 AM

## 2015-05-26 NOTE — Progress Notes (Signed)
[facility] [place] at [facility]                                                                                                                                                                                            Patient Demographics   [name], is a 81 y.o. female, DOB - 09/30/1932, ZOX:[id number]  Admit date - 06/20/15   Admitting Physician Arnaldo Natal, MD  Outpatient Primary MD for the patient is Bobbye Morton, MD   LOS - 0  Subjective: Patient has dementia and a poor historian, does report that she's been having abdominal pain and vomiting.     Review of Systems:   Not a very good historian due to her dementia and unable to provide much review of systems  Vitals:   Filed Vitals:   2015-06-20 0645 06/20/15 0659 06/20/2015 0720 06/20/15 0732  BP:    127/67  Pulse: 71   72  Temp:  98.2 F (36.8 C)  98.1 F (36.7 C)  TempSrc:  Oral  Oral  Resp: 19   18  Height:      Weight:   52.572 kg (115 lb 14.4 oz)   SpO2: 98%   99%    Wt Readings from Last 3 Encounters:  June 20, 2015 52.572 kg (115 lb 14.4 oz)     Intake/Output Summary (Last 24 hours) at 2015-06-20 1304 Last data filed at 06/20/15 1049  Gross per 24 hour  Intake      0 ml  Output      0 ml  Net      0 ml    Physical Exam:   GENERAL: Pleasant-appearing in no apparent distress.  HEAD, EYES, EARS, NOSE AND THROAT: Atraumatic, normocephalic. Extraocular muscles are intact. Pupils equal and reactive to light. Sclerae anicteric. No conjunctival injection. No oro-pharyngeal erythema.  NECK: Supple. There is no jugular venous distention. No bruits, no lymphadenopathy, no thyromegaly.  HEART: Regular rate and rhythm,. No murmurs, no rubs, no clicks.  LUNGS: Clear to auscultation bilaterally. No rales or rhonchi. No wheezes.  ABDOMEN: Soft, flat, nontender, nondistended. Has good bowel sounds. No hepatosplenomegaly appreciated.  EXTREMITIES: No evidence of any cyanosis, clubbing,  or peripheral edema.  +2 pedal and radial pulses bilaterally.  NEUROLOGIC: The patient is alert, awake, and oriented x3 with no focal motor or sensory deficits appreciated bilaterally.  SKIN: Moist and warm with no rashes appreciated.  Psych: Not anxious, depressed LN: No inguinal LN enlargement    Antibiotics   Anti-infectives    Start     Dose/Rate Route Frequency Ordered Stop   05/27/15  1000  cefTRIAXone (ROCEPHIN) 1 g in dextrose 5 % 50 mL IVPB     1 g 100 mL/hr over 30 Minutes Intravenous Every 24 hours 30-May-2015 0726     05-30-15 0430  cefTRIAXone (ROCEPHIN) 1 g in dextrose 5 % 50 mL IVPB     1 g 100 mL/hr over 30 Minutes Intravenous  Once 05/30/15 0415 2015-05-30 0605      Medications   Scheduled Meds: . [START ON 05/27/2015] cefTRIAXone (ROCEPHIN)  IV  1 g Intravenous Q24H  . divalproex  500 mg Oral Daily  . docusate sodium  100 mg Oral BID  . feeding supplement (ENSURE ENLIVE)  237 mL Oral Daily  . ferrous sulfate  325 mg Oral BID  . heparin  5,000 Units Subcutaneous 3 times per day  . imipramine  50 mg Oral QHS  . oxybutynin  5 mg Oral QHS  . pantoprazole  40 mg Oral Daily  . polyvinyl alcohol  2 drop Left Eye 6 X Daily  . simvastatin  20 mg Oral QPM  . sucralfate  1 g Oral TID  . venlafaxine XR  150 mg Oral BH-q7a  . vitamin B-12  1,000 mcg Oral BH-q7a  . Vitamin D (Ergocalciferol)  50,000 Units Oral Q30 days   Continuous Infusions: . sodium chloride 100 mL/hr at 05/30/2015 0749   PRN Meds:.acetaminophen **OR** acetaminophen, docusate sodium, HYDROmorphone (DILAUDID) injection, morphine injection, ondansetron, polyethylene glycol   Data Review:   Micro Results No results found for this or any previous visit (from the past 240 hour(s)).  Radiology Reports Ct Abdomen Pelvis W Contrast  2015-05-30   CLINICAL DATA:  Acute onset of vomiting and hematemesis. Lower abdominal pain and dysuria. Initial encounter.  EXAM: CT ABDOMEN AND PELVIS WITH CONTRAST  TECHNIQUE:  Multidetector CT imaging of the abdomen and pelvis was performed using the standard protocol following bolus administration of intravenous contrast.  CONTRAST:  80mL OMNIPAQUE IOHEXOL 300 MG/ML  SOLN  COMPARISON:  CT of the abdomen and pelvis from 08/10/2014  FINDINGS: Bibasilar atelectasis is noted.  A large paraesophageal hernia is seen, containing the entirety of the stomach. The stomach is largely decompressed. Apparent diffuse gastric wall thickening could reflect gastritis, given the patient's symptoms. There is no evidence for obstruction.  The liver and spleen are unremarkable in appearance. The gallbladder is difficult to fully assess due to motion artifact, but appears grossly unremarkable. The pancreas and adrenal glands are unremarkable.  Bilateral parapelvic renal cysts are seen, more prominent on the left. There is no evidence of hydronephrosis. No renal or ureteral stones are seen. No perinephric stranding is appreciated.  There is dilatation of the proximal abdominal aorta to 3.7 cm in AP dimension and 3.5 cm in transverse dimension, at the level of the superior mesenteric artery. Underlying diffuse calcification is noted along the abdominal aorta and its branches. Aneurysmal dilatation resolves just above the level of the renal arteries. Diffuse calcification is noted along the celiac trunk and superior mesenteric artery, and at the origins of the renal arteries. There is ectasia of the infrarenal abdominal aorta, without additional aneurysmal dilatation.  No free fluid is identified. The small bowel is unremarkable in appearance. No acute vascular abnormalities are seen.  The appendix is not well seen. There is no evidence of appendicitis. The colon is grossly unremarkable in appearance, though difficult to fully assess due to motion artifact. Stool is seen partially filling the colon.  The bladder is moderately distended and grossly unremarkable. The  patient is status hysterectomy. No suspicious  adnexal masses are seen. The ovaries are grossly symmetric. No inguinal lymphadenopathy is seen.  No acute osseous abnormalities are identified. Endplate sclerotic change and vacuum phenomenon are seen at L3-L4, and vacuum phenomenon is noted at L5-S1.  IMPRESSION: 1. Large paraesophageal hernia containing the entirety of the stomach. Apparent diffuse gastric wall thickening could reflect gastritis, given the patient's symptoms. No evidence for obstruction at this time. 2. Dilatation of the proximal abdominal aorta to 3.7 cm in AP dimension and 3.5 cm in transverse dimension, at the level of the superior mesenteric artery. Aneurysmal dilatation resolves just above the level of the renal arteries. Recommend followup by ultrasound in 2 years. This recommendation follows ACR consensus guidelines: White Paper of the ACR Incidental Findings Committee II on Vascular Findings. J Am Coll Radiol 2013; 10:789-794. Underlying diffuse calcification noted along the abdominal aorta, and along the celiac trunk and superior mesenteric artery. Calcification seen at the origins of the renal arteries. 3. Bibasilar atelectasis noted. 4. Bilateral parapelvic renal cysts noted, more prominent on the left.   Electronically Signed   By: Roanna Raider M.D.   On: 06/03/15 03:51     CBC  Recent Labs Lab 2015-06-03 0043  WBC 18.4*  HGB 13.4  HCT 42.1  PLT 231  MCV 96.8  MCH 30.8  MCHC 31.8*  RDW 14.0    Chemistries   Recent Labs Lab 06/03/15 0043  NA 140  K 4.4  CL 99*  CO2 31  GLUCOSE 157*  BUN 24*  CREATININE 0.90  CALCIUM 9.6  AST 24  ALT 15  ALKPHOS 92  BILITOT 0.4   ------------------------------------------------------------------------------------------------------------------ estimated creatinine clearance is 35.2 mL/min (by C-G formula based on Cr of 0.9). ------------------------------------------------------------------------------------------------------------------ No results for input(s):  HGBA1C in the last 72 hours. ------------------------------------------------------------------------------------------------------------------ No results for input(s): CHOL, HDL, LDLCALC, TRIG, CHOLHDL, LDLDIRECT in the last 72 hours. ------------------------------------------------------------------------------------------------------------------  Recent Labs  2015-06-03 0043  TSH 4.079   ------------------------------------------------------------------------------------------------------------------ No results for input(s): VITAMINB12, FOLATE, FERRITIN, TIBC, IRON, RETICCTPCT in the last 72 hours.  Coagulation profile No results for input(s): INR, PROTIME in the last 168 hours.  No results for input(s): DDIMER in the last 72 hours.  Cardiac Enzymes No results for input(s): CKMB, TROPONINI, MYOGLOBIN in the last 168 hours.  Invalid input(s): CK ------------------------------------------------------------------------------------------------------------------ Invalid input(s): POCBNP    Assessment & Plan   81 year old Caucasian female admitted for UTI nausea vomiting abdominal. 1. UTI: Nitrite positive urine. Continue Rocephin and follow urine cultures  2, abdominal pain nausea vomiting; likely due to severe esophageal hernia: Place on Protonix IV twice a day. GI consult patient may need surgical evaluation as well 3. Schizoaffective disorder: Apparently stable but contributes to the patient's inability to contribute to her own history. No indication that her mental status is below baseline 4. COPD: Continue inhalers per home regimen 5. DVT prophylaxis: Heparin  The patient is a DO NOT RESUSCITATE.      Code Status Orders        Start     Ordered   06-03-15 0727  Do not attempt resuscitation (DNR)   Continuous    Question Answer Comment  In the event of cardiac or respiratory ARREST Do not call a "code blue"   In the event of cardiac or respiratory ARREST Do not perform  Intubation, CPR, defibrillation or ACLS   In the event of cardiac or respiratory ARREST Use medication by any route, position, wound care, and other  measures to relive pain and suffering. May use oxygen, suction and manual treatment of airway obstruction as needed for comfort.      2015/06/19 0726    Advance Directive Documentation        Most Recent Value   Type of Advance Directive  Out of facility DNR (pink MOST or yellow form) [Only a copy of form available, original at [facility] facility]   Pre-existing out of facility DNR order (yellow form or pink MOST form)     "MOST" Form in Place?                DVT ProphylaxisHeparin   Lab Results  Component Value Date   PLT 231 06-19-2015     Time Spent in minutes   55 minutes   Auburn Bilberry M.D on 06-19-15 at 1:04 PM  Between 7am to 6pm - Pager - [phone]  After 6pm go to [email or web address] - password [email or web address] [facility]  [facility]   Office  [phone]

## 2015-05-26 NOTE — Progress Notes (Signed)
Notified Dr. Clint Guy of patient's continued complaint of abdominal pain despite the IV Morphine, IV protonix and carafate given. MD to place order and will carry them out as written. Will continue to monitor.

## 2015-05-26 NOTE — ED Provider Notes (Signed)
[facility] Emergency Department Provider Note  ____________________________________________  Time seen: 12:44 AM  I have reviewed the triage vital signs and the nursing notes.   HISTORY  Chief Complaint Abdominal Pain; Dysuria; and Emesis     HPI [name] is a 81 y.o. female who has been sent to the emergency department from [facility] due to vomiting. The report from the nursing home reports lower abdominal pain and dysuria since yesterday morning. The patient has some limited communication, a history of schizoaffective disorder. She reports she has had pain for approximately a week.  The nursing home reports that was some blood noted in the emesis. Patient denies any diarrhea.   Past Medical History  Diagnosis Date  . Schizoaffective disorder   . Anemia   . COPD (chronic obstructive pulmonary disease)   . Dysphagia     There are no active problems to display for this patient.   History reviewed. No pertinent past surgical history.  Current Outpatient Rx  Name  Route  Sig  Dispense  Refill  . Carboxymethylcellul-Glycerin (OPTIVE) 0.5-0.9 % SOLN   Left Eye   Place 2 drops into the left eye 6 (six) times daily.         . divalproex (DEPAKOTE ER) 500 MG 24 hr tablet   Oral   Take 500 mg by mouth daily.         Marland Kitchen docusate sodium (COLACE) 100 MG capsule   Oral   Take 100 mg by mouth daily as needed for mild constipation (at bedtime).         . ENSURE PLUS (ENSURE PLUS) LIQD   Oral   Take 237 mLs by mouth daily.         . ergocalciferol (VITAMIN D2) 50000 UNITS capsule   Oral   Take 50,000 Units by mouth every 30 (thirty) days.         . ferrous sulfate (FEOSOL) 325 (65 FE) MG tablet   Oral   Take 325 mg by mouth 2 (two) times daily.         Marland Kitchen imipramine (TOFRANIL) 50 MG tablet   Oral   Take 50 mg by mouth at bedtime.         . ondansetron (ZOFRAN-ODT) 4 MG disintegrating tablet   Oral   Take 4 mg by mouth  every 8 (eight) hours as needed for nausea or vomiting.         Marland Kitchen oxybutynin (DITROPAN-XL) 5 MG 24 hr tablet   Oral   Take 5 mg by mouth at bedtime.         . pantoprazole (PROTONIX) 40 MG tablet   Oral   Take 40 mg by mouth daily.         . polyethylene glycol (MIRALAX / GLYCOLAX) packet   Oral   Take 17 g by mouth daily as needed for mild constipation.         . simvastatin (ZOCOR) 20 MG tablet   Oral   Take 20 mg by mouth every evening.         . sucralfate (CARAFATE) 1 G tablet   Oral   Take 1 g by mouth 3 (three) times daily.         Marland Kitchen venlafaxine XR (EFFEXOR-XR) 150 MG 24 hr capsule   Oral   Take 150 mg by mouth every morning.         . vitamin B-12 (CYANOCOBALAMIN) 1000 MCG tablet  Oral   Take 1,000 mcg by mouth every morning.           Allergies Aspirin  History reviewed. No pertinent family history.  Social History Social History  Substance Use Topics  . Smoking status: Former Games developer  . Smokeless tobacco: None  . Alcohol Use: No    Review of Systems  Constitutional: Negative for fever. ENT: Negative for sore throat. Cardiovascular: Negative for chest pain. Respiratory: Negative for shortness of breath. Gastrointestinal: positive for vomiting as well as lower abdominal pain. See history of present illness. Genitourinary: Negative for dysuria. Musculoskeletal: No myalgias or injuries. Skin: Negative for rash. Neurological: Negative for headaches   10-point ROS otherwise negative.  ____________________________________________   PHYSICAL EXAM:  VITAL SIGNS: ED Triage Vitals  Enc Vitals Group     BP 05/27/2015 0032 120/73 mmHg     Pulse Rate 06/04/2015 0032 93     Resp 05/14/2015 0032 20     Temp 05/21/2015 0032 98.3 F (36.8 C)     Temp Source 05/25/2015 0032 Oral     SpO2 06/01/2015 0032 93 %     Weight 05/27/2015 0032 125 lb (56.7 kg)     Height 05/30/2015 0032 5' (1.524 m)     Head Cir --      Peak Flow --      Pain Score --       Pain Loc --      Pain Edu? --      Excl. in GC? --     Constitutional:  Alert. Interactive but with some limited communication. Appears a little bit uncomfortable. ENT   Head: Normocephalic and atraumatic.   Nose: No congestion/rhinnorhea.   Mouth/Throat: Mucous membranes are moist. Cardiovascular: Normal rate, regular rhythm, no murmur noted Respiratory:  Normal respiratory effort, no tachypnea.    Breath sounds are clear and equal bilaterally.  Gastrointestinal: Soft. Mild distention. Mild diffuse tenderness.  Back: No muscle spasm, no tenderness, no CVA tenderness. Musculoskeletal: No deformity noted. Nontender with normal range of motion in all extremities.  No noted edema. Neurologic:  Normal speech and language. No gross focal neurologic deficits are appreciated.  Skin:  Skin is warm, dry. No rash noted. Psychiatric: alert, communicative although with some limited speech. She has some noted random movements of her tongue.  ____________________________________________    LABS (pertinent positives/negatives)  Labs Reviewed  LIPASE, BLOOD - Abnormal; Notable for the following:    Lipase 16 (*)    All other components within normal limits  COMPREHENSIVE METABOLIC PANEL - Abnormal; Notable for the following:    Chloride 99 (*)    Glucose, Bld 157 (*)    BUN 24 (*)    GFR calc non Af Amer 58 (*)    All other components within normal limits  CBC - Abnormal; Notable for the following:    WBC 18.4 (*)    MCHC 31.8 (*)    All other components within normal limits  URINALYSIS COMPLETEWITH MICROSCOPIC ([facility] ONLY) - Abnormal; Notable for the following:    Color, Urine AMBER (*)    APPearance CLOUDY (*)    Ketones, ur TRACE (*)    Protein, ur 30 (*)    Nitrite POSITIVE (*)    Bacteria, UA RARE (*)    Squamous Epithelial / LPF 0-5 (*)    All other components within normal limits  URINE CULTURE     ____________________________________________   RADIOLOGY  CT  scan abdomen and pelvis:  IMPRESSION: 1.  Large paraesophageal hernia containing the entirety of the stomach. Apparent diffuse gastric wall thickening could reflect gastritis, given the patient's symptoms. No evidence for obstruction at this time. 2. Dilatation of the proximal abdominal aorta to 3.7 cm in AP dimension and 3.5 cm in transverse dimension, at the level of the superior mesenteric artery. Aneurysmal dilatation resolves just above the level of the renal arteries. Recommend followup by ultrasound in 2 years. This recommendation follows ACR consensus guidelines: White Paper of the ACR Incidental Findings Committee II on Vascular Findings. J Am Coll Radiol 2013; 10:789-794. Underlying diffuse calcification noted along the abdominal aorta, and along the celiac trunk and superior mesenteric artery. Calcification seen at the origins of the renal arteries. 3. Bibasilar atelectasis noted. 4. Bilateral parapelvic renal cysts noted, more prominent on the left.  ____________________________________________   INITIAL IMPRESSION / ASSESSMENT AND PLAN / ED COURSE  Pertinent labs & imaging results that were available during my care of the patient were reviewed by me and considered in my medical decision making (see chart for details).  Uncomfortable appearing 81 year old female with some limited communication and mild diffuse tenderness to her abdomen. We will obtain a CT scan through her abdomen.  ----------------------------------------- 4:08 AM on 06/12/2015 -----------------------------------------  CT scan results reviewed and noted. White blood cell count elevated at 18,000. Urine overall negative, though there is a positive nitrite reading.  Given the foul-smelling cloudiness of the urine along with the positive nitrite reading, I do think that a urinary tract infection is the most likely cause for this patient's lower abdominal pain. We will treat with antibiotics and admitted  to the hospital. This is been discussed with Dr. Sheryle Hail. ____________________________________________   FINAL CLINICAL IMPRESSION(S) / ED DIAGNOSES  Final diagnoses:  Lower abdominal pain  Acute UTI (urinary tract infection)  hiatal hernia, chronic    Darien Ramus, MD 06/06/2015 [phone]

## 2015-05-26 NOTE — ED Notes (Signed)
Holding pt in ED until 0700. Floor unable to accept pt at this time. Pt resting w/o complaint.

## 2015-05-26 NOTE — ED Notes (Signed)
Pt transported from [facility] w/ EMS report from [facility] that pt has been vomiting all day and blood noted in emesis. Pt c/o lower abdominal pain and dysuria since yesterday morning. Pt is oriented to self only, unsure if this is baseline mentation. Pt is agitated but cooperative. Abdomen is not tender to palpation.

## 2015-05-27 DIAGNOSIS — K449 Diaphragmatic hernia without obstruction or gangrene: Secondary | ICD-10-CM

## 2015-05-27 LAB — MRSA PCR SCREENING: MRSA BY PCR: NEGATIVE

## 2015-05-27 MED ORDER — NITROGLYCERIN 2 % TD OINT
0.5000 [in_us] | TOPICAL_OINTMENT | Freq: Four times a day (QID) | TRANSDERMAL | Status: DC
  Administered 2015-05-27 – 2015-05-28 (×3): 0.5 [in_us] via TOPICAL
  Filled 2015-05-27 (×4): qty 1

## 2015-05-27 MED ORDER — HYDROMORPHONE HCL 1 MG/ML IJ SOLN
1.0000 mg | INTRAMUSCULAR | Status: DC | PRN
  Administered 2015-05-27: 1 mg via INTRAVENOUS
  Filled 2015-05-27: qty 1

## 2015-05-27 MED ORDER — HYDRALAZINE HCL 20 MG/ML IJ SOLN
10.0000 mg | Freq: Four times a day (QID) | INTRAMUSCULAR | Status: DC | PRN
  Administered 2015-05-27: 10 mg via INTRAVENOUS
  Filled 2015-05-27 (×2): qty 1

## 2015-05-27 MED ORDER — FUROSEMIDE 10 MG/ML IJ SOLN
40.0000 mg | Freq: Once | INTRAMUSCULAR | Status: AC
  Administered 2015-05-27: 40 mg via INTRAVENOUS
  Filled 2015-05-27: qty 4

## 2015-05-27 MED ORDER — MORPHINE SULFATE 2 MG/ML IJ SOLN
2.0000 mg | Freq: Once | INTRAMUSCULAR | Status: AC
  Administered 2015-05-27: 2 mg via INTRAVENOUS
  Filled 2015-05-27: qty 1

## 2015-05-27 MED ORDER — PROMETHAZINE HCL 25 MG/ML IJ SOLN
12.5000 mg | Freq: Four times a day (QID) | INTRAMUSCULAR | Status: DC | PRN
  Administered 2015-05-27 – 2015-05-28 (×3): 12.5 mg via INTRAVENOUS
  Filled 2015-05-27 (×3): qty 1

## 2015-05-27 MED ORDER — SODIUM CHLORIDE 0.9 % IV SOLN
3.0000 g | Freq: Four times a day (QID) | INTRAVENOUS | Status: DC
  Administered 2015-05-27 – 2015-05-28 (×4): 3 g via INTRAVENOUS
  Filled 2015-05-27 (×9): qty 3

## 2015-05-27 MED ORDER — PROMETHAZINE HCL 25 MG/ML IJ SOLN
12.5000 mg | Freq: Once | INTRAMUSCULAR | Status: AC
  Administered 2015-05-27: 12.5 mg via INTRAVENOUS
  Filled 2015-05-27: qty 1

## 2015-05-27 NOTE — Progress Notes (Signed)
Spoke to Dr. Allena Katz about High Blood Pressure and he said he will order medication.

## 2015-05-27 NOTE — Progress Notes (Signed)
Pt calling out gaging and saying she can't breath.  Oxygen applied, Vitals taken, RT called, and MD paged.  Dr. Allena Katz came to room and ordered NG tube.

## 2015-05-27 NOTE — Progress Notes (Signed)
Notified Dr. Clint Guy of patient's continued complaint of abdominal pain and brown emesis. MD to place orders. Will continue to monitor

## 2015-05-27 NOTE — Progress Notes (Signed)
[facility] [place] at [facility]                                                                                                                                                                                            Patient Demographics   [name], is a 81 y.o. female, DOB - [birth year], VWU:[id number]  Admit date - 06-19-15   Admitting Physician Arnaldo Natal, MD  Outpatient Primary MD for the patient is Bobbye Morton, MD   LOS - 1  Subjective: Called by nurse urgently due to patient having respiratory distress, she had  emesis and complained of abominal pain all night     Review of Systems:   Not a very good historian due to her dementia and unable to provide much review of systems  Vitals:   Filed Vitals:   05/27/15 0905 05/27/15 0906 05/27/15 0908 05/27/15 0911  BP:  196/119 193/133 198/98  Pulse:  98 88   Temp:  97.5 F (36.4 C)    TempSrc:  Oral    Resp:      Height:      Weight:      SpO2: 65% 99% 99%     Wt Readings from Last 3 Encounters:  05/27/15 52.572 kg (115 lb 14.4 oz)     Intake/Output Summary (Last 24 hours) at 05/27/15 0919 Last data filed at 05/27/15 2956  Gross per 24 hour  Intake   2994 ml  Output      2 ml  Net   2992 ml    Physical Exam:   GENERAL:resp distress  HEAD, EYES, EARS, NOSE AND THROAT: Atraumatic, normocephalic. Extraocular muscles are intact. Pupils equal and reactive to light. Sclerae anicteric. No conjunctival injection. No oro-pharyngeal erythema.  NECK: Supple. There is no jugular venous distention. No bruits, no lymphadenopathy, no thyromegaly.  HEART: Regular rate and rhythm,. No murmurs, no rubs, no clicks.  LUNGS: decreased bs , accesory muscle usage  ABDOMEN: Soft, flat,+ tender epigastric area, nondistended. Has good bowel sounds. No hepatosplenomegaly appreciated.  EXTREMITIES: No evidence of any cyanosis, clubbing, or peripheral edema.  +2 pedal and radial pulses bilaterally.   NEUROLOGIC: The patient is alert not oriented to place to time or person with no focal motor or sensory deficits appreciated bilaterally.  SKIN: Moist and warm with no rashes appreciated.  Psych: Not anxious, depressed LN: No inguinal LN enlargement    Antibiotics   Anti-infectives    Start     Dose/Rate Route Frequency Ordered Stop   05/27/15 1000  cefTRIAXone (ROCEPHIN) 1 g in dextrose 5 % 50 mL  IVPB  Status:  Discontinued     1 g 100 mL/hr over 30 Minutes Intravenous Every 24 hours Jun 13, 2015 0726 05/27/15 0906   05/27/15 0915  Ampicillin-Sulbactam (UNASYN) 3 g in sodium chloride 0.9 % 100 mL IVPB     3 g 100 mL/hr over 60 Minutes Intravenous Every 6 hours 05/27/15 0906     June 13, 2015 0430  cefTRIAXone (ROCEPHIN) 1 g in dextrose 5 % 50 mL IVPB     1 g 100 mL/hr over 30 Minutes Intravenous  Once 06-13-2015 0415 06/13/2015 0605      Medications   Scheduled Meds: . ampicillin-sulbactam (UNASYN) IV  3 g Intravenous Q6H  . divalproex  500 mg Oral Daily  . feeding supplement (ENSURE ENLIVE)  237 mL Oral Daily  . ferrous sulfate  325 mg Oral BID  . furosemide  40 mg Intravenous Once  . heparin  5,000 Units Subcutaneous 3 times per day  . imipramine  50 mg Oral QHS  . nitroGLYCERIN  0.5 inch Topical 4 times per day  . oxybutynin  5 mg Oral QHS  . pantoprazole (PROTONIX) IV  40 mg Intravenous Q12H  . polyvinyl alcohol  2 drop Left Eye 6 X Daily  . simvastatin  20 mg Oral QPM  . sucralfate  1 g Oral TID  . venlafaxine XR  150 mg Oral BH-q7a  . vitamin B-12  1,000 mcg Oral BH-q7a  . Vitamin D (Ergocalciferol)  50,000 Units Oral Q30 days   Continuous Infusions:   PRN Meds:.acetaminophen **OR** acetaminophen, HYDROmorphone (DILAUDID) injection, morphine injection, ondansetron, polyethylene glycol, promethazine   Data Review:   Micro Results Recent Results (from the past 240 hour(s))  MRSA PCR Screening     Status: None   Collection Time: 2015-06-13 11:02 PM  Result Value Ref Range  Status   MRSA by PCR NEGATIVE NEGATIVE Final    Comment:        The GeneXpert MRSA Assay (FDA approved for NASAL specimens only), is one component of a comprehensive MRSA colonization surveillance program. It is not intended to diagnose MRSA infection nor to guide or monitor treatment for MRSA infections.     Radiology Reports Ct Abdomen Pelvis W Contrast  06-13-2015   CLINICAL DATA:  Acute onset of vomiting and hematemesis. Lower abdominal pain and dysuria. Initial encounter.  EXAM: CT ABDOMEN AND PELVIS WITH CONTRAST  TECHNIQUE: Multidetector CT imaging of the abdomen and pelvis was performed using the standard protocol following bolus administration of intravenous contrast.  CONTRAST:  80mL OMNIPAQUE IOHEXOL 300 MG/ML  SOLN  COMPARISON:  CT of the abdomen and pelvis from 08/10/2014  FINDINGS: Bibasilar atelectasis is noted.  A large paraesophageal hernia is seen, containing the entirety of the stomach. The stomach is largely decompressed. Apparent diffuse gastric wall thickening could reflect gastritis, given the patient's symptoms. There is no evidence for obstruction.  The liver and spleen are unremarkable in appearance. The gallbladder is difficult to fully assess due to motion artifact, but appears grossly unremarkable. The pancreas and adrenal glands are unremarkable.  Bilateral parapelvic renal cysts are seen, more prominent on the left. There is no evidence of hydronephrosis. No renal or ureteral stones are seen. No perinephric stranding is appreciated.  There is dilatation of the proximal abdominal aorta to 3.7 cm in AP dimension and 3.5 cm in transverse dimension, at the level of the superior mesenteric artery. Underlying diffuse calcification is noted along the abdominal aorta and its branches. Aneurysmal dilatation resolves just above the  level of the renal arteries. Diffuse calcification is noted along the celiac trunk and superior mesenteric artery, and at the origins of the renal  arteries. There is ectasia of the infrarenal abdominal aorta, without additional aneurysmal dilatation.  No free fluid is identified. The small bowel is unremarkable in appearance. No acute vascular abnormalities are seen.  The appendix is not well seen. There is no evidence of appendicitis. The colon is grossly unremarkable in appearance, though difficult to fully assess due to motion artifact. Stool is seen partially filling the colon.  The bladder is moderately distended and grossly unremarkable. The patient is status hysterectomy. No suspicious adnexal masses are seen. The ovaries are grossly symmetric. No inguinal lymphadenopathy is seen.  No acute osseous abnormalities are identified. Endplate sclerotic change and vacuum phenomenon are seen at L3-L4, and vacuum phenomenon is noted at L5-S1.  IMPRESSION: 1. Large paraesophageal hernia containing the entirety of the stomach. Apparent diffuse gastric wall thickening could reflect gastritis, given the patient's symptoms. No evidence for obstruction at this time. 2. Dilatation of the proximal abdominal aorta to 3.7 cm in AP dimension and 3.5 cm in transverse dimension, at the level of the superior mesenteric artery. Aneurysmal dilatation resolves just above the level of the renal arteries. Recommend followup by ultrasound in 2 years. This recommendation follows ACR consensus guidelines: White Paper of the ACR Incidental Findings Committee II on Vascular Findings. J Am Coll Radiol 2013; 10:789-794. Underlying diffuse calcification noted along the abdominal aorta, and along the celiac trunk and superior mesenteric artery. Calcification seen at the origins of the renal arteries. 3. Bibasilar atelectasis noted. 4. Bilateral parapelvic renal cysts noted, more prominent on the left.   Electronically Signed   By: Roanna Raider M.D.   On: 05/25/2015 03:51     CBC  Recent Labs Lab 06/12/2015 0043  WBC 18.4*  HGB 13.4  HCT 42.1  PLT 231  MCV 96.8  MCH 30.8  MCHC  31.8*  RDW 14.0    Chemistries   Recent Labs Lab 06/01/2015 0043  NA 140  K 4.4  CL 99*  CO2 31  GLUCOSE 157*  BUN 24*  CREATININE 0.90  CALCIUM 9.6  AST 24  ALT 15  ALKPHOS 92  BILITOT 0.4   ------------------------------------------------------------------------------------------------------------------ estimated creatinine clearance is 35.2 mL/min (by C-G formula based on Cr of 0.9). ------------------------------------------------------------------------------------------------------------------  Recent Labs  05/27/2015 0043  HGBA1C 6.2*   ------------------------------------------------------------------------------------------------------------------ No results for input(s): CHOL, HDL, LDLCALC, TRIG, CHOLHDL, LDLDIRECT in the last 72 hours. ------------------------------------------------------------------------------------------------------------------  Recent Labs  06/06/2015 0043  TSH 4.079   ------------------------------------------------------------------------------------------------------------------ No results for input(s): VITAMINB12, FOLATE, FERRITIN, TIBC, IRON, RETICCTPCT in the last 72 hours.  Coagulation profile No results for input(s): INR, PROTIME in the last 168 hours.  No results for input(s): DDIMER in the last 72 hours.  Cardiac Enzymes No results for input(s): CKMB, TROPONINI, MYOGLOBIN in the last 168 hours.  Invalid input(s): CK ------------------------------------------------------------------------------------------------------------------ Invalid input(s): POCBNP    Assessment & Plan   81 year old Caucasian female admitted for UTI nausea vomiting abdominal. 1. Acute resp failure suspect aspiration, stat cxr, change abx to unasy, iv lasix, stat cxr 2, abdominal pain nausea vomiting; likely due to severe esophageal hernia: progrive pain seen by gi signed off, ask surgery to see, try ngt, supportive care  3. Schizoaffective  disorder: continue home treatment   4. COPD: Continue inhalers per home regimen 5. uti present on adimission unasyn cx neg The patient is a DO NOT RESUSCITATE. Patient  ward of the state     Code Status Orders        Start     Ordered   05/30/2015 0727  Do not attempt resuscitation (DNR)   Continuous    Question Answer Comment  In the event of cardiac or respiratory ARREST Do not call a "code blue"   In the event of cardiac or respiratory ARREST Do not perform Intubation, CPR, defibrillation or ACLS   In the event of cardiac or respiratory ARREST Use medication by any route, position, wound care, and other measures to relive pain and suffering. May use oxygen, suction and manual treatment of airway obstruction as needed for comfort.      05/24/2015 0726    Advance Directive Documentation        Most Recent Value   Type of Advance Directive  Out of facility DNR (pink MOST or yellow form) [Only a copy of form available, original at [facility] facility]   Pre-existing out of facility DNR order (yellow form or pink MOST form)     "MOST" Form in Place?                DVT ProphylaxisHeparin   Lab Results  Component Value Date   PLT 231 06/12/2015     Time Spent in minutes   crical care time spent  Auburn Bilberry M.D on 05/27/2015 at 9:19 AM  Between 7am to 6pm - Pager - [phone]  After 6pm go to [email or web address] - password [email or web address] [facility]  [facility]   Office  [phone]

## 2015-05-27 NOTE — Progress Notes (Signed)
Patient with continued emesis and complaints of abdominal pain despite morphine, zofran and phenergan. Dr. Clint Guy. No NG tube due to patient's history of dementia and schizophrenia. Voiding incontinent. Patient up the entire night.

## 2015-05-27 NOTE — Progress Notes (Signed)
Dr patel  Jeanene Erb to room to check pt after charge nurse and staff pt came to room finding pt with grey color stating " i can't breathe",making choking sounds. o2  Sat in the 60. o2 applied to 4liters . Pt verbalizing right sided abdominal pain.

## 2015-05-27 NOTE — Progress Notes (Addendum)
RN ATTEMPTED NG TUBE PLACEMENTX3 . UNSUCCESSFUL. MET RESISTENT GOING DOWN PAST THROAT DOWN TO ESOPHAGUS AND PT ALSO TRYING TO PULL TUBE OUT WITH 4 STAFF MEMBERS PRESENT. DR PATEL NOTIFIED

## 2015-05-27 NOTE — Progress Notes (Signed)
Unable to insert ngt due to resistance

## 2015-05-27 NOTE — Consult Note (Signed)
[name] is a 81 y.o. female  admitted with possible urosepsis nausea vomiting and abdominal pain.  HPI: This patient was admitted with possible urosepsis abdominal pain nausea and vomiting. She is an 81 year old woman who has profound dementia. Workup when she was admitted demonstrated what appeared to be a large paraesophageal hernia with majority of her stomach in her chest. The CT scan did not demonstrate any evidence for gastric volvulus and there did not appear to be any evidence obstruction. Contrast passed through without difficulty. Apparently she's had continued problems with nausea and vomiting over the last 24 hours. She has respiratory distress which is felt to be possibly due to aspiration. Nasogastric intubation was attempted without success. The surgical service was consulted for possible surgical repair.  No history is possible as there is no family present and the patient is profoundly demented. She does not have the ability to cooperate with her examination or history taking. She does have a DO NOT RESUSCITATE order in place.  Past Medical History  Diagnosis Date  . Schizoaffective disorder   . Anemia   . COPD (chronic obstructive pulmonary disease)   . Dysphagia    History reviewed. No pertinent past surgical history. Social History   Social History  . Marital Status: Widowed    Spouse Name: N/A  . Number of Children: N/A  . Years of Education: N/A   Social History Main Topics  . Smoking status: Former Games developer  . Smokeless tobacco: None  . Alcohol Use: No  . Drug Use: No  . Sexual Activity: No   Other Topics Concern  . None   Social History Narrative  . None    Review of Systems: Review of Systems  Unable to perform ROS: dementia    PHYSICAL EXAM: BP 165/70 mmHg  Pulse 95  Temp(Src) 97.1 F (36.2 C) (Axillary)  Resp 18  Ht 5' (1.524 m)  Wt 115 lb 14.4 oz (52.572 kg)  BMI 22.64 kg/m2  SpO2 100%  Physical Exam  Constitutional:  Lying in bed  asleep mildly cachectic.  HENT:  Head: Normocephalic and atraumatic.  Eyes: EOM are normal.  Neck: Normal range of motion. Neck supple.  Cardiovascular: Regular rhythm and normal heart sounds.   No murmur heard. Pulmonary/Chest:  She has mild increased respiratory effort. She does not appear to have any adventitious sounds. She is coughing during the examination.  Abdominal: Soft. Bowel sounds are normal.  Musculoskeletal:  Impossible to determine  Neurological:  Confused mildly combative  Skin: Skin is warm and dry.  Psychiatric:  Profoundly demented   Her abdomen is soft nontender with no organomegaly good bowel sounds. She does not have any abdominal tenderness.  Impression/Plan: I independently reviewed her CT scan. She does appear to have the majority of her stomach in her chest. This problem would be a chronic condition. There is no evidence of any gastric volvulus. In a woman with profound dementia significant other medical problems she is not a candidate for surgical intervention. There are no indications for emergent surgery this time.   Tiney Rouge III, MD  05/27/2015, 3:31 PM

## 2015-05-27 NOTE — Consult Note (Signed)
Pt had problems with trial of insertion of NG tube for vomiting and abd pain.  Likely due to large paraesophageal hernia which can cause signif obstruction.  I will try to do an EGD tomorrow. Abd soft at this time with bowel sounds.

## 2015-05-27 NOTE — Progress Notes (Addendum)
Patient is a ward of the state and [place] [facility] Social workers who are the guardians are in charge of the case.  The Son-in-law and daughter came here today asking questions and wanting to know information.  We did not give any information since the patient's legal guardian has not been contacted for a password.  Dr. Markham Jordan plans to to an EGD Monday but will need to get consent from the guardian tomorrow.   GuardianMarletta Lor [place] [phone] cell

## 2015-05-27 NOTE — Clinical Social Work Note (Signed)
Clinical Social Work Assessment  Patient Details  Name: CHYNNA BUERKLE MRN: [id number] Date of Birth: Sep 03, 1932  Date of referral:  05/27/15               Reason for consult:   (from [facility])                Permission sought to share information with:  Guardian Permission granted to share information::  No  Name::      (DSS Guardian Marletta Lor [phone]  or [phone])  Agency::     Relationship::     Contact Information:     Housing/Transportation Living arrangements for the past 2 months:  Skilled Nursing Facility Source of Information:  Facility Patient Interpreter Needed:  None Criminal Activity/Legal Involvement Pertinent to Current Situation/Hospitalization:   (none reported ) Significant Relationships:  Adult Children (DSS Guardian) Lives with:  Facility Resident Do you feel safe going back to the place where you live?    Need for family participation in patient care:   (DSS Guardian)  Care giving concerns:  None reported at this time   Office manager / plan:  CSW in to meet with patient, she is a poor historian and alert to self on at this time.  Patient is a resident of [facility] and a guardian of [place] DSS.  Guardian is Marletta Lor [phone]  or [phone].  Call to [facility] unable to obtain additional information on patient as patient has only been there one week.  Per weekend staff they didn't have much information on patient however stated patient ambulated with a walker at the facility. Call to patient's daughter Cordelia Pen [phone]  or [phone] no answer.     CSW will complete FL2 in anticipation of patient returning to facility when medically ready. CSW will reach out to Guardian on Monday.   Employment status:  Disabled (Comment on whether or not currently receiving Disability) Insurance information:  Medicaid In [place], [employer] PT Recommendations:  Skilled Nursing Facility Information /  Referral to community resources:  Skilled Nursing Facility  Patient/Family's Response to care:  Unable to speak to family or guardian   Patient/Family's Understanding of and Emotional Response to Diagnosis, Current Treatment, and Prognosis:  Unable to speak to family or guardian.  Patient orient to self only.   Emotional Assessment Appearance:  Appears older than stated age Attitude/Demeanor/Rapport:  Unable to Assess Affect (typically observed):  Unable to Assess Orientation:  Oriented to Self Alcohol / Substance use:    Psych involvement (Current and /or in the community):     Discharge Needs  Concerns to be addressed:  Care Coordination Readmission within the last 30 days:  No Current discharge risk:  Chronically ill, Cognitively Impaired Barriers to Discharge:  No Barriers Identified   Soundra Pilon, LCSW 05/27/2015, 4:45 PM Sammuel Hines. Theresia Majors, MSW Clinical Social Work Department Emergency Room [phone] 4:51 PM

## 2015-05-28 ENCOUNTER — Encounter: Admission: EM | Disposition: E | Payer: Self-pay | Source: Home / Self Care | Attending: Internal Medicine

## 2015-05-28 ENCOUNTER — Inpatient Hospital Stay: Payer: Medicare (Managed Care)

## 2015-05-28 LAB — URINE CULTURE
Culture: >=100000
Special Requests: NORMAL

## 2015-05-28 LAB — CBC
HEMATOCRIT: 44.3 % (ref 35.0–47.0)
Hemoglobin: 14.5 g/dL (ref 12.0–16.0)
MCH: 31.1 pg (ref 26.0–34.0)
MCHC: 32.8 g/dL (ref 32.0–36.0)
MCV: 94.8 fL (ref 80.0–100.0)
Platelets: 319 10*3/uL (ref 150–440)
RBC: 4.67 MIL/uL (ref 3.80–5.20)
RDW: 14.2 % (ref 11.5–14.5)
WBC: 26.8 10*3/uL — AB (ref 3.6–11.0)

## 2015-05-28 LAB — BASIC METABOLIC PANEL
ANION GAP: 17 — AB (ref 5–15)
BUN: 20 mg/dL (ref 6–20)
CALCIUM: 9 mg/dL (ref 8.9–10.3)
CO2: 26 mmol/L (ref 22–32)
Chloride: 99 mmol/L — ABNORMAL LOW (ref 101–111)
Creatinine, Ser: 0.93 mg/dL (ref 0.44–1.00)
GFR, EST NON AFRICAN AMERICAN: 56 mL/min — AB (ref >60)
Glucose, Bld: 284 mg/dL — ABNORMAL HIGH (ref 65–99)
Potassium: 3.4 mmol/L — ABNORMAL LOW (ref 3.5–5.1)
Sodium: 142 mmol/L (ref 135–145)

## 2015-05-28 SURGERY — EGD (ESOPHAGOGASTRODUODENOSCOPY)
Anesthesia: Monitor Anesthesia Care

## 2015-05-28 MED ORDER — MORPHINE SULFATE 2 MG/ML IJ SOLN
2.0000 mg | Freq: Once | INTRAMUSCULAR | Status: AC
  Administered 2015-05-28: 2 mg via INTRAVENOUS

## 2015-05-28 MED ORDER — MORPHINE SULFATE 2 MG/ML IJ SOLN
1.0000 mg | INTRAMUSCULAR | Status: DC | PRN
  Administered 2015-05-28: 1 mg via INTRAVENOUS

## 2015-05-28 MED ORDER — FUROSEMIDE 10 MG/ML IJ SOLN: 40.0000 mg | Freq: Two times a day (BID) | INTRAMUSCULAR | Status: DC

## 2015-05-28 MED ORDER — MORPHINE SULFATE 2 MG/ML IJ SOLN
INTRAMUSCULAR | Status: AC
  Filled 2015-05-28: qty 1

## 2015-05-28 MED ORDER — HALOPERIDOL LACTATE 5 MG/ML IJ SOLN: 2.0000 mg | Freq: Once | INTRAMUSCULAR | Status: DC

## 2015-06-14 NOTE — Progress Notes (Signed)
MD notified. Patient in extreme pain thrashing in bed. Orders received to give a one time dose of  morphine

## 2015-06-14 NOTE — Progress Notes (Signed)
Called by nurse patient passed away at 8:13am Pt without any pulse or respiration she prounnced dead at bedside by me. Ward of state notified, I had spoken to [place] updated her on condition.  Cause of death: acute respiratory failure Gastric volvulus

## 2015-06-14 NOTE — Progress Notes (Signed)
MD notified that patient in rapid decline.  IV pain medication given for comfort.  Respirations shallow and infrequent.  Oxygen increased for additional comfort.  MD to call [facility] case worker with state to discuss patient.

## 2015-06-14 NOTE — Progress Notes (Signed)
Patient very uncomfortable thrashing in bed. BP elevated 190/102. MD states elevated BP is pain driven. Received order for haldol.

## 2015-06-14 NOTE — Progress Notes (Signed)
CSW was notified of Pt's this morning. Pt is newly a ward of the state since last week. CSW spoke to [employer] with [place] DSS and they notified the daughter. The daughter's rights are returned to her at Pt's death per DSS. Daughter able to have any information and make decisions for body. Pt is from [facility], CSW notified SNF of Pt's death as well.   CSW spoke to Pt's daughter who is on the way to the hospital. Chaplain notified as well.   RNs updated.   Wilford Grist, LCSW [phone]

## 2015-06-14 NOTE — Progress Notes (Signed)
[facility] [place] at [facility]                                                                                                                                                                                            Patient Demographics   [name], is a 81 y.o. female, DOB - 03-28-1932, ZOX:[id number]  Admit date - 05/20/2015   Admitting Physician Arnaldo Natal, MD  Outpatient Primary MD for the patient is Bobbye Morton, MD   LOS - 2  Subjective: called this morning again by nurse stating patient respiratory status not well. curretly patient appears pale and hypoxic   Review of Systems:   Not a very good historian due to her dementia and unable to provide much review of systems  Vitals:   Filed Vitals:   05/27/15 1205 05/27/15 2207 2015/05/31 0403 2015/05/31 0536  BP: 165/70 185/92  94/61  Pulse: 95 98  105  Temp: 97.1 F (36.2 C) 98.7 F (37.1 C)    TempSrc: Axillary Oral    Resp:  16  20  Height:      Weight:   51.665 kg (113 lb 14.4 oz)   SpO2: 100% 98%  93%    Wt Readings from Last 3 Encounters:  31-May-2015 51.665 kg (113 lb 14.4 oz)     Intake/Output Summary (Last 24 hours) at 05-31-2015 0811 Last data filed at 05/27/15 1100  Gross per 24 hour  Intake      0 ml  Output      0 ml  Net      0 ml    Physical Exam:   GENERAL:resp distress critcally ill HEAD, EYES, EARS, NOSE AND THROAT: Atraumatic, normocephalic. Extraocular muscles are intact. Pupils equal and reactive to light. Sclerae anicteric. No conjunctival injection. No oro-pharyngeal erythema.  NECK: Supple. There is no jugular venous distention. No bruits, no lymphadenopathy, no thyromegaly.  HEART: Regular rate and rhythm,. No murmurs, no rubs, no clicks.  LUNGS: decreased bs , accesory muscle usage b/l cracles ABDOMEN: Soft, flat,+ tender epigastric area, nondistended. Has good bowel sounds. No hepatosplenomegaly appreciated.  EXTREMITIES: No evidence of any cyanosis,  clubbing, or peripheral edema.  +2 pedal and radial pulses bilaterally.  NEUROLOGIC: decrease in responsiveness SKIN: Moist and warm with no rashes appreciated.  Psych: Not anxious, depressed LN: No inguinal LN enlargement    Antibiotics   Anti-infectives    Start     Dose/Rate Route Frequency Ordered Stop   05/27/15 1000  cefTRIAXone (ROCEPHIN) 1 g in dextrose 5 % 50 mL IVPB  Status:  Discontinued  1 g 100 mL/hr over 30 Minutes Intravenous Every 24 hours 06/13/15 0726 05/27/15 0906   05/27/15 0915  Ampicillin-Sulbactam (UNASYN) 3 g in sodium chloride 0.9 % 100 mL IVPB     3 g 100 mL/hr over 60 Minutes Intravenous Every 6 hours 05/27/15 0906     06/13/2015 0430  cefTRIAXone (ROCEPHIN) 1 g in dextrose 5 % 50 mL IVPB     1 g 100 mL/hr over 30 Minutes Intravenous  Once 06-13-15 0415 June 13, 2015 0605      Medications   Scheduled Meds: . ampicillin-sulbactam (UNASYN) IV  3 g Intravenous Q6H  . divalproex  500 mg Oral Daily  . feeding supplement (ENSURE ENLIVE)  237 mL Oral Daily  . ferrous sulfate  325 mg Oral BID  . furosemide  40 mg Intravenous Q12H  . haloperidol lactate  2 mg Intravenous Once  . heparin  5,000 Units Subcutaneous 3 times per day  . imipramine  50 mg Oral QHS  . nitroGLYCERIN  0.5 inch Topical 4 times per day  . oxybutynin  5 mg Oral QHS  . pantoprazole (PROTONIX) IV  40 mg Intravenous Q12H  . polyvinyl alcohol  2 drop Left Eye 6 X Daily  . simvastatin  20 mg Oral QPM  . sucralfate  1 g Oral TID  . venlafaxine XR  150 mg Oral BH-q7a  . vitamin B-12  1,000 mcg Oral BH-q7a  . Vitamin D (Ergocalciferol)  50,000 Units Oral Q30 days   Continuous Infusions:   PRN Meds:.acetaminophen **OR** acetaminophen, hydrALAZINE, HYDROmorphone (DILAUDID) injection, morphine injection, ondansetron, polyethylene glycol, promethazine   Data Review:   Micro Results Recent Results (from the past 240 hour(s))  Urine culture     Status: None (Preliminary result)   Collection  Time: Jun 13, 2015 12:43 AM  Result Value Ref Range Status   Specimen Description URINE, RANDOM  Final   Special Requests Normal  Final   Culture   Final    >=100,000 COLONIES/mL GRAM NEGATIVE RODS IDENTIFICATION AND SUSCEPTIBILITIES TO FOLLOW    Report Status PENDING  Incomplete  MRSA PCR Screening     Status: None   Collection Time: June 13, 2015 11:02 PM  Result Value Ref Range Status   MRSA by PCR NEGATIVE NEGATIVE Final    Comment:        The GeneXpert MRSA Assay (FDA approved for NASAL specimens only), is one component of a comprehensive MRSA colonization surveillance program. It is not intended to diagnose MRSA infection nor to guide or monitor treatment for MRSA infections.     Radiology Reports Dg Chest 1 View  05/25/2015   CLINICAL DATA:  Respiratory failure  EXAM: CHEST  1 VIEW  COMPARISON:  Abdominal pelvic CT scan of 06/13/2015  FINDINGS: The stomach is largely intra thoracic. The lungs are adequately inflated. The interstitial markings on the right are mildly increased diffusely. The left retrocardiac region is dense but partially obscured by the intra thoracic stomach. The cardiac silhouette is mildly enlarged but stable. The pulmonary vascularity is not engorged. The bony thorax is unremarkable.  IMPRESSION: Mild diffuse increase in the interstitial markings especially on the right. This may reflect interstitial edema or pneumonia. The possibility of aspiration is raised.   Electronically Signed   By: David  [place] M.D.   On: 05/23/2015 07:39   Ct Abdomen Pelvis W Contrast  06-13-2015   CLINICAL DATA:  Acute onset of vomiting and hematemesis. Lower abdominal pain and dysuria. Initial encounter.  EXAM: CT ABDOMEN  AND PELVIS WITH CONTRAST  TECHNIQUE: Multidetector CT imaging of the abdomen and pelvis was performed using the standard protocol following bolus administration of intravenous contrast.  CONTRAST:  80mL OMNIPAQUE IOHEXOL 300 MG/ML  SOLN  COMPARISON:  CT of the  abdomen and pelvis from 08/10/2014  FINDINGS: Bibasilar atelectasis is noted.  A large paraesophageal hernia is seen, containing the entirety of the stomach. The stomach is largely decompressed. Apparent diffuse gastric wall thickening could reflect gastritis, given the patient's symptoms. There is no evidence for obstruction.  The liver and spleen are unremarkable in appearance. The gallbladder is difficult to fully assess due to motion artifact, but appears grossly unremarkable. The pancreas and adrenal glands are unremarkable.  Bilateral parapelvic renal cysts are seen, more prominent on the left. There is no evidence of hydronephrosis. No renal or ureteral stones are seen. No perinephric stranding is appreciated.  There is dilatation of the proximal abdominal aorta to 3.7 cm in AP dimension and 3.5 cm in transverse dimension, at the level of the superior mesenteric artery. Underlying diffuse calcification is noted along the abdominal aorta and its branches. Aneurysmal dilatation resolves just above the level of the renal arteries. Diffuse calcification is noted along the celiac trunk and superior mesenteric artery, and at the origins of the renal arteries. There is ectasia of the infrarenal abdominal aorta, without additional aneurysmal dilatation.  No free fluid is identified. The small bowel is unremarkable in appearance. No acute vascular abnormalities are seen.  The appendix is not well seen. There is no evidence of appendicitis. The colon is grossly unremarkable in appearance, though difficult to fully assess due to motion artifact. Stool is seen partially filling the colon.  The bladder is moderately distended and grossly unremarkable. The patient is status hysterectomy. No suspicious adnexal masses are seen. The ovaries are grossly symmetric. No inguinal lymphadenopathy is seen.  No acute osseous abnormalities are identified. Endplate sclerotic change and vacuum phenomenon are seen at L3-L4, and vacuum  phenomenon is noted at L5-S1.  IMPRESSION: 1. Large paraesophageal hernia containing the entirety of the stomach. Apparent diffuse gastric wall thickening could reflect gastritis, given the patient's symptoms. No evidence for obstruction at this time. 2. Dilatation of the proximal abdominal aorta to 3.7 cm in AP dimension and 3.5 cm in transverse dimension, at the level of the superior mesenteric artery. Aneurysmal dilatation resolves just above the level of the renal arteries. Recommend followup by ultrasound in 2 years. This recommendation follows ACR consensus guidelines: White Paper of the ACR Incidental Findings Committee II on Vascular Findings. J Am Coll Radiol 2013; 10:789-794. Underlying diffuse calcification noted along the abdominal aorta, and along the celiac trunk and superior mesenteric artery. Calcification seen at the origins of the renal arteries. 3. Bibasilar atelectasis noted. 4. Bilateral parapelvic renal cysts noted, more prominent on the left.   Electronically Signed   By: Roanna Raider M.D.   On: 05/27/2015 03:51     CBC  Recent Labs Lab 2015/05/27 0043 05/24/2015 0356  WBC 18.4* 26.8*  HGB 13.4 14.5  HCT 42.1 44.3  PLT 231 319  MCV 96.8 94.8  MCH 30.8 31.1  MCHC 31.8* 32.8  RDW 14.0 14.2    Chemistries   Recent Labs Lab 27-May-2015 0043 06/07/2015 0356  NA 140 142  K 4.4 3.4*  CL 99* 99*  CO2 31 26  GLUCOSE 157* 284*  BUN 24* 20  CREATININE 0.90 0.93  CALCIUM 9.6 9.0  AST 24  --   ALT  15  --   ALKPHOS 92  --   BILITOT 0.4  --    ------------------------------------------------------------------------------------------------------------------ estimated creatinine clearance is 34.1 mL/min (by C-G formula based on Cr of 0.93). ------------------------------------------------------------------------------------------------------------------  Recent Labs  06/25/15 0043  HGBA1C 6.2*    ------------------------------------------------------------------------------------------------------------------ No results for input(s): CHOL, HDL, LDLCALC, TRIG, CHOLHDL, LDLDIRECT in the last 72 hours. ------------------------------------------------------------------------------------------------------------------  Recent Labs  Jun 25, 2015 0043  TSH 4.079   ------------------------------------------------------------------------------------------------------------------ No results for input(s): VITAMINB12, FOLATE, FERRITIN, TIBC, IRON, RETICCTPCT in the last 72 hours.  Coagulation profile No results for input(s): INR, PROTIME in the last 168 hours.  No results for input(s): DDIMER in the last 72 hours.  Cardiac Enzymes No results for input(s): CKMB, TROPONINI, MYOGLOBIN in the last 168 hours.  Invalid input(s): CK ------------------------------------------------------------------------------------------------------------------ Invalid input(s): POCBNP    Assessment & Plan   81 year old Caucasian female admitted for UTI nausea vomiting abdominal. 1. Acute resp failure  Due to combination of fluid overload and apiration pna, contineu iv abx and lasix 2, abdominal pain nausea vomiting; gastric volvulus, not surgical candiate 3. Schizoaffective disorder: continue home treatment  4. COPD: Continue inhalers per home regimen 5. uti present on adimission unasyn cx neg The patient is a DO NOT RESUSCITATE. Patient ward of the state     Code Status Orders        Start     Ordered   25-Jun-2015 0727  Do not attempt resuscitation (DNR)   Continuous    Question Answer Comment  In the event of cardiac or respiratory ARREST Do not call a "code blue"   In the event of cardiac or respiratory ARREST Do not perform Intubation, CPR, defibrillation or ACLS   In the event of cardiac or respiratory ARREST Use medication by any route, position, wound care, and other measures to relive pain  and suffering. May use oxygen, suction and manual treatment of airway obstruction as needed for comfort.      Jun 25, 2015 0726    Advance Directive Documentation        Most Recent Value   Type of Advance Directive  Out of facility DNR (pink MOST or yellow form) [Only a copy of form available, original at [facility] facility]   Pre-existing out of facility DNR order (yellow form or pink MOST form)     "MOST" Form in Place?                DVT ProphylaxisHeparin   Lab Results  Component Value Date   PLT 319 05/25/2015     Time Spent in minutes   crical care time spent  Auburn Bilberry M.D on 05/29/2015 at 8:11 AM  Between 7am to 6pm - Pager - [phone]  After 6pm go to [email or web address] - password [email or web address] [facility]  [facility]   Office  [phone]

## 2015-06-14 NOTE — Progress Notes (Signed)
Family arrived and MD was notified to come discuss patients decline.  Chaplain called to consult with family.  Pace coordinator present at bedside.  Family decided to use [facility] in [place] for funeral services.  Pace coordinator called to [facility].

## 2015-06-14 NOTE — Discharge Summary (Signed)
  Date of Admission: 06-14-15 12:21 AM  Date of death:06/16/2015   Admitting diagnosis:  Vomiting    Diagnosis at time of death 1. Gastric volvulus 2. Acute CHF type unknown 3. Aspiration pneumonia 4. Dementia 5. Anemia 6. COPD 7. Chronic dysphagia  Hospital course: Patient was a 81 year old female with dementia and schizoaffective disorder who was was sent to the emergency room from a facility for vomiting. Patient was seen in the emergency room and was noted to have a CT scan of the abdomen showed stomach and esophagus to esophageal hernia. She was seen by GI Dr. Mechele Collin who felt that she likely had gastric volvulus. Patient's symptoms continued to get worse she continued to have emesis. And then she is developed respiratory distress as well. It was felt that her respiratory distress was combination of acute CHF type unknown as well as aspiration pneumonia. She was treated aggressively for her CHF and aspiration pneumonia. Due to her being DO NOT RESUSCITATE she was not intubated. She was also seen by surgery who felt that she was not a candidate for any surgical intervention based on her age dementia and her respiratory distress. Patient earlier this morning continue to have further respiratory worsening and was noted to have no respirations no pulse and patient was pronounced dead. After her daughter arrived to the hospital and discussed with her regarding the hospital course and explain her the patient's clinical presentation and hospital course.                 TOTAL TIME TAKING CARE OF THIS PATIENT: 35 minutes.    Auburn Bilberry M.D on Jun 16, 2015 at 2:48 PM  Between 7am to 6pm - Pager - [phone]  After 6pm go to [email or web address] - password [email or web address] [facility]  [place] [facility]  Office  [phone]  CC: Primary care physician; Bobbye Morton, MD

## 2015-06-14 DEATH — deceased

## 2017-02-01 IMAGING — CT CT ABD-PELV W/ CM
1 of 5 series · 10 of 32 positions shown, 16 images · IV contrast (omnipaque)
Comparison: CT of the abdomen and pelvis from 08/10/2014

CLINICAL DATA: Acute onset of vomiting and hematemesis. Lower
abdominal pain and dysuria. Initial encounter.

EXAM:
CT ABDOMEN AND PELVIS WITH CONTRAST
TECHNIQUE: Multidetector CT imaging of the abdomen and pelvis was performed
using the standard protocol following bolus administration of
intravenous contrast.
CONTRAST:  80mL OMNIPAQUE IOHEXOL 300 MG/ML  SOLN

[Series 2: routine abd pel with · axial · 0.62mm/px · z∈[-459,-109]mm · 10 of 86 slices shown, 16 images]
[im 8/86  soft-tissue]
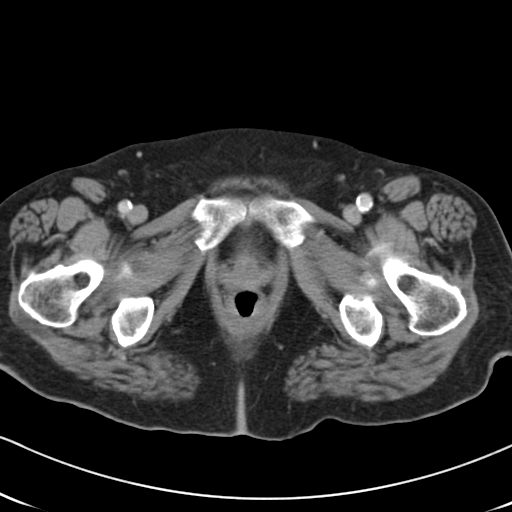
[im 8/86  bone]
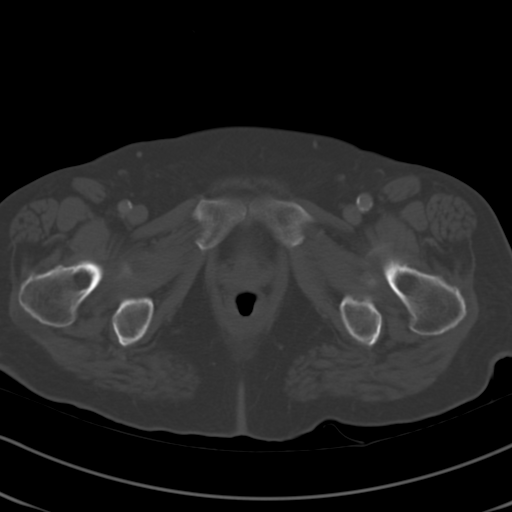
[im 16/86  soft-tissue]
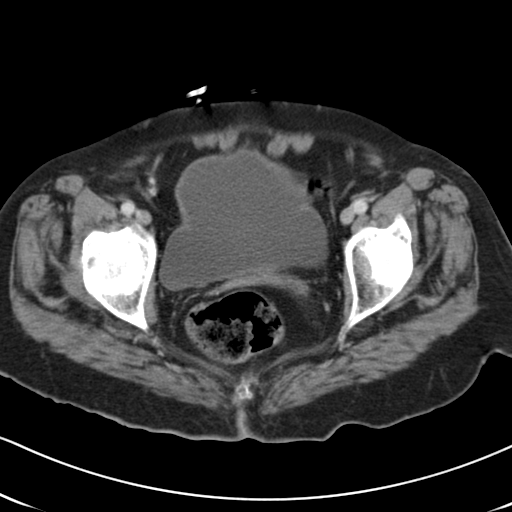
[im 24/86  soft-tissue]
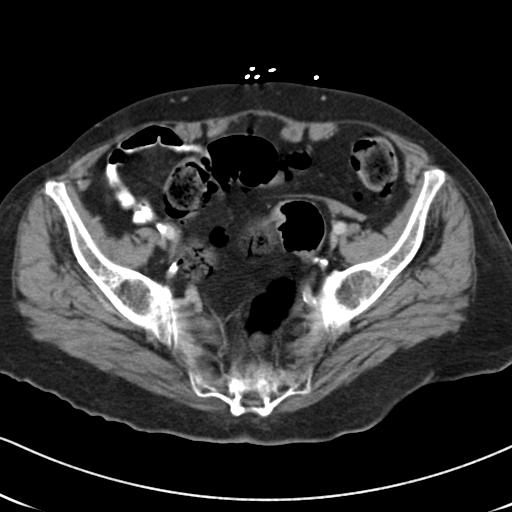
[im 31/86  soft-tissue]
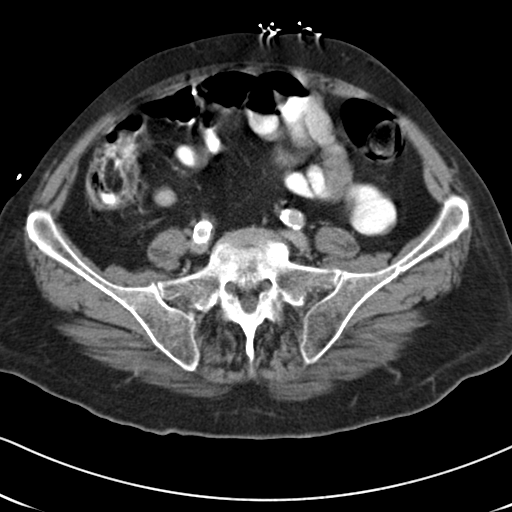
[im 39/86  soft-tissue]
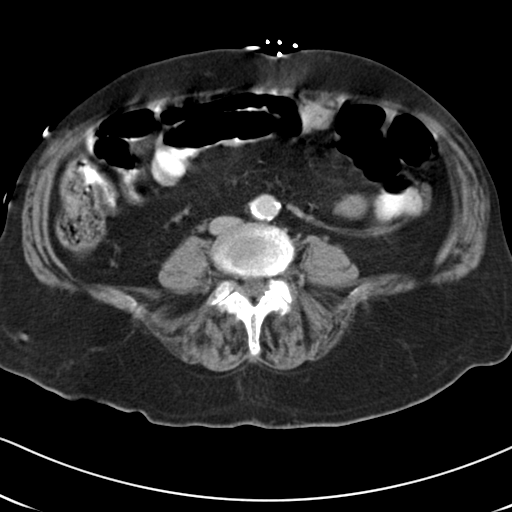
[im 47/86  soft-tissue]
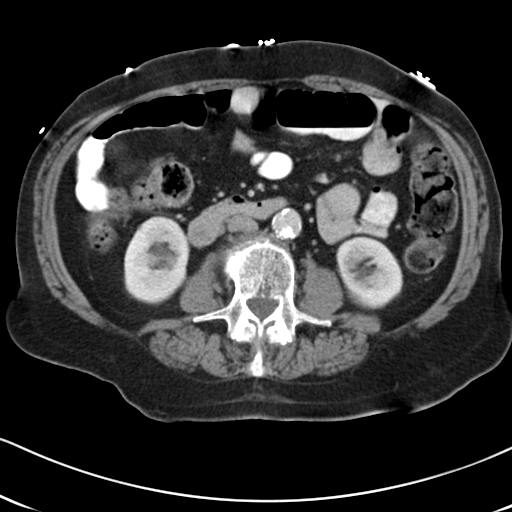
[im 55/86  soft-tissue]
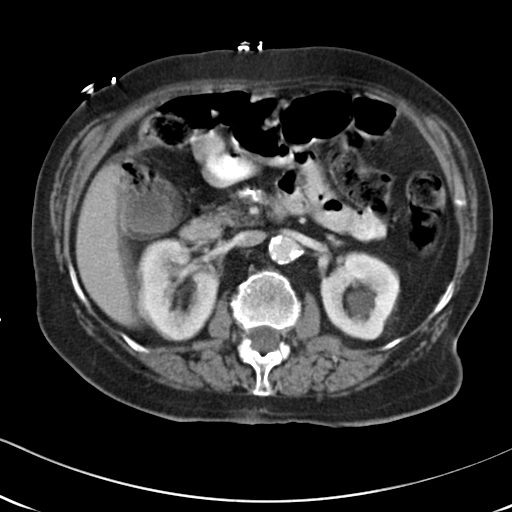
[im 55/86  lung]
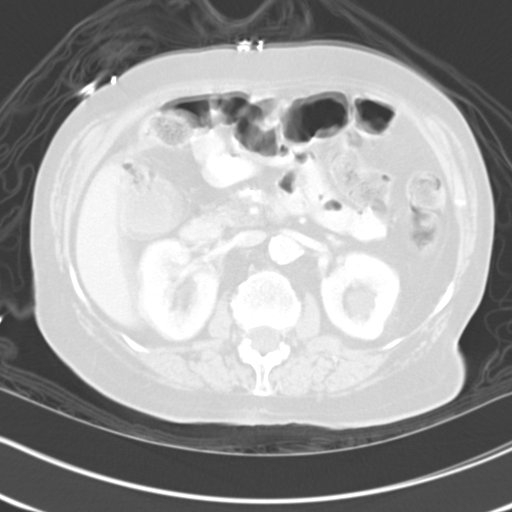
[im 62/86  soft-tissue]
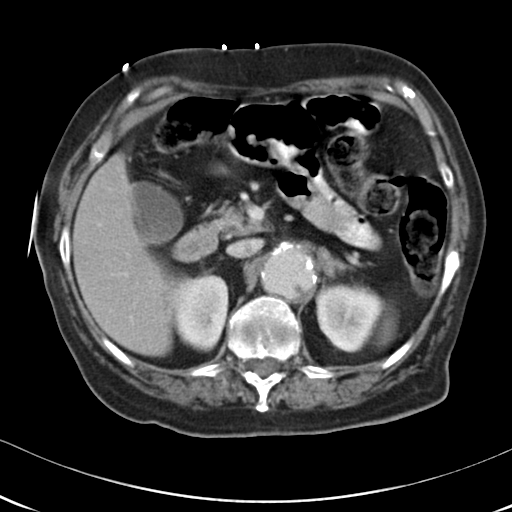
[im 62/86  lung]
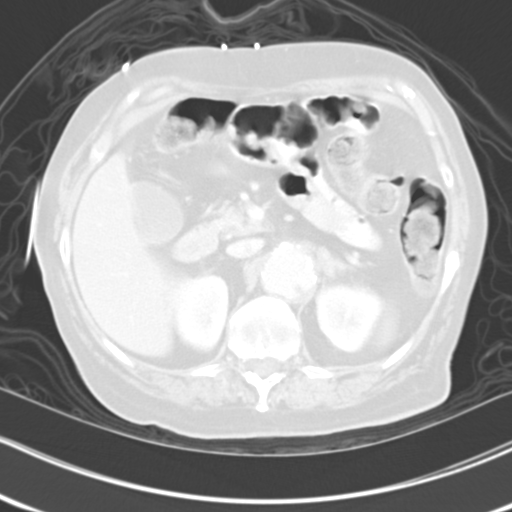
[im 70/86  soft-tissue]
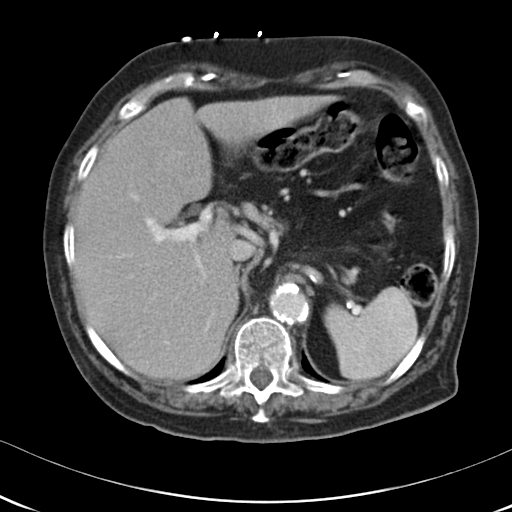
[im 70/86  lung]
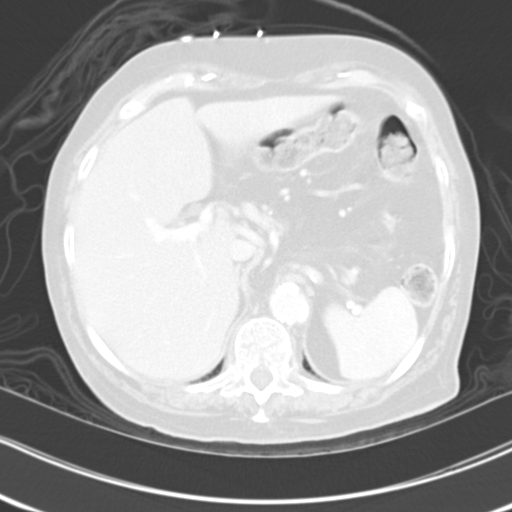
[im 70/86  bone]
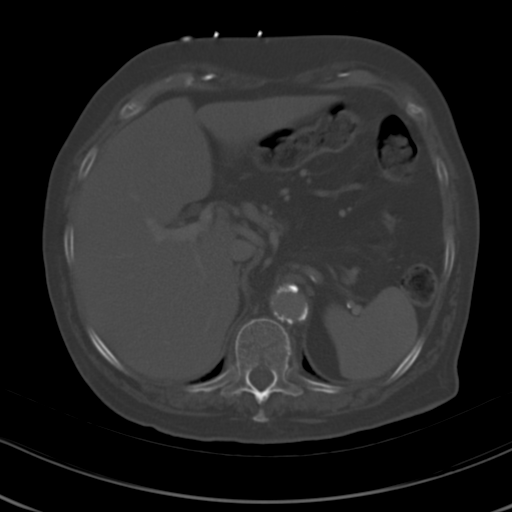
[im 78/86  soft-tissue]
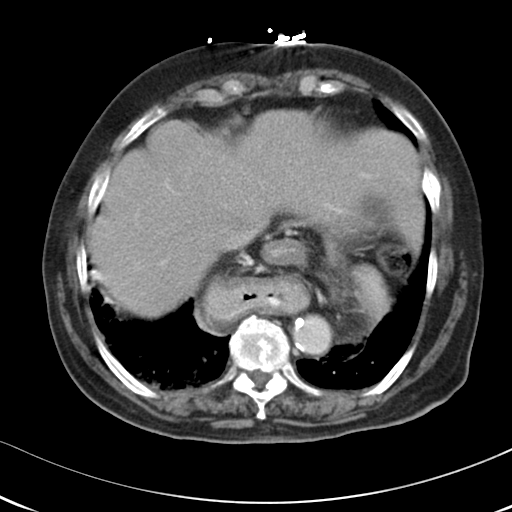
[im 78/86  lung]
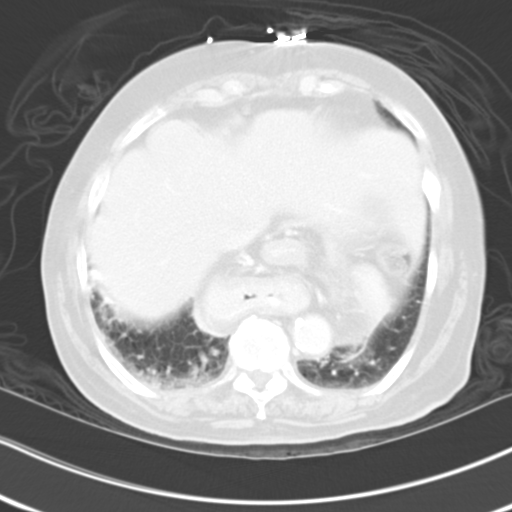

[10 of 32 positions shown; findings below may reference images not displayed]

FINDINGS: Bibasilar atelectasis is noted.

A large paraesophageal hernia is seen, containing the entirety of
the stomach. The stomach is largely decompressed. Apparent diffuse
gastric wall thickening could reflect gastritis, given the patient's
symptoms. There is no evidence for obstruction.

The liver and spleen are unremarkable in appearance. The gallbladder
is difficult to fully assess due to motion artifact, but appears
grossly unremarkable. The pancreas and adrenal glands are
unremarkable.

Bilateral parapelvic renal cysts are seen, more prominent on the
left. There is no evidence of hydronephrosis. No renal or ureteral
stones are seen. No perinephric stranding is appreciated.

There is dilatation of the proximal abdominal aorta to 3.7 cm in AP
dimension and 3.5 cm in transverse dimension, at the level of the
superior mesenteric artery. Underlying diffuse calcification is
noted along the abdominal aorta and its branches. Aneurysmal
dilatation resolves just above the level of the renal arteries.
Diffuse calcification is noted along the celiac trunk and superior
mesenteric artery, and at the origins of the renal arteries. There
is ectasia of the infrarenal abdominal aorta, without additional
aneurysmal dilatation.

No free fluid is identified. The small bowel is unremarkable in
appearance. No acute vascular abnormalities are seen.

The appendix is not well seen. There is no evidence of appendicitis.
The colon is grossly unremarkable in appearance, though difficult to
fully assess due to motion artifact. Stool is seen partially filling
the colon.

The bladder is moderately distended and grossly unremarkable. The
patient is status hysterectomy. No suspicious adnexal masses are
seen. The ovaries are grossly symmetric. No inguinal lymphadenopathy
is seen.

No acute osseous abnormalities are identified. Endplate sclerotic
change and vacuum phenomenon are seen at L3-L4, and vacuum
phenomenon is noted at L5-S1.
IMPRESSION: 1. Large paraesophageal hernia containing the entirety of the
stomach. Apparent diffuse gastric wall thickening could reflect
gastritis, given the patient's symptoms. No evidence for obstruction
at this time.
2. Dilatation of the proximal abdominal aorta to 3.7 cm in AP
dimension and 3.5 cm in transverse dimension, at the level of the
superior mesenteric artery. Aneurysmal dilatation resolves just
above the level of the renal arteries. Recommend followup by
ultrasound in 2 years. This recommendation follows ACR consensus
guidelines: White Paper of the ACR Incidental Findings Committee II
on Vascular Findings. [HOSPITAL] 8513; [DATE]. Underlying
diffuse calcification noted along the abdominal aorta, and along the
celiac trunk and superior mesenteric artery. Calcification seen at
the origins of the renal arteries.
3. Bibasilar atelectasis noted.
4. Bilateral parapelvic renal cysts noted, more prominent on the
left.

## 2017-02-03 IMAGING — CR DG CHEST 1V
1 series · 1 of 1 positions shown · non-contrast
Comparison: Abdominal pelvic CT scan May 26, 2015

CLINICAL DATA: Respiratory failure

EXAM:
CHEST  1 VIEW

[portable]
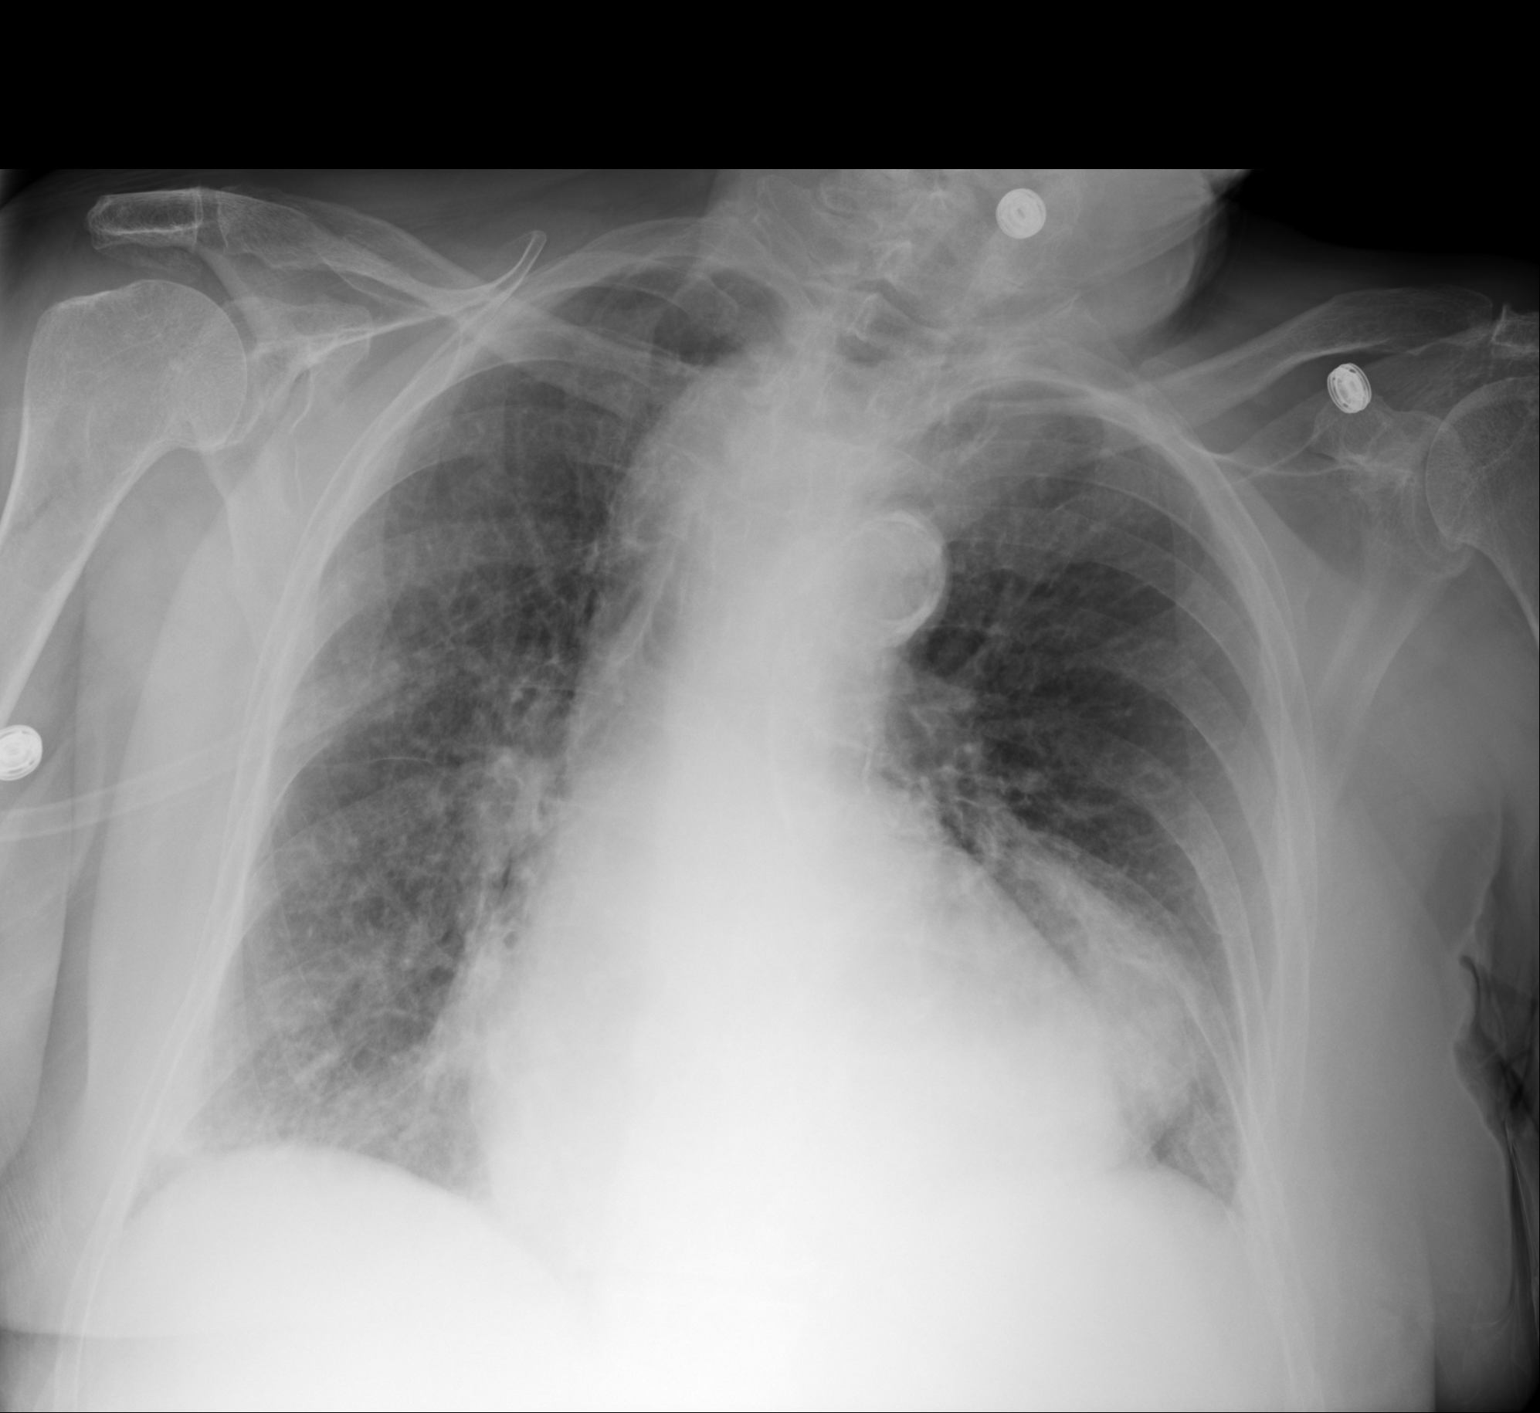

[1 of 1 positions shown; findings below may reference images not displayed]

FINDINGS: The stomach is largely intra thoracic. The lungs are adequately
inflated. The interstitial markings on the right are mildly
increased diffusely. The left retrocardiac region is dense but
partially obscured by the intra thoracic stomach. The cardiac
silhouette is mildly enlarged but stable. The pulmonary vascularity
is not engorged. The bony thorax is unremarkable.
IMPRESSION: Mild diffuse increase in the interstitial markings especially on the
right. This may reflect interstitial edema or pneumonia. The
possibility of aspiration is raised.
